# Patient Record
Sex: Female | Born: 1990 | Race: Black or African American | Hispanic: No | Marital: Single | Smoking: Never smoker
Health system: Southern US, Community
[De-identification: ages and names within clinical notes are randomized; demographics above are authoritative.]

## PROBLEM LIST (undated history)

## (undated) DIAGNOSIS — A749 Chlamydial infection, unspecified: Secondary | ICD-10-CM

## (undated) DIAGNOSIS — N83209 Unspecified ovarian cyst, unspecified side: Secondary | ICD-10-CM

## (undated) DIAGNOSIS — IMO0002 Reserved for concepts with insufficient information to code with codable children: Secondary | ICD-10-CM

## (undated) DIAGNOSIS — R87619 Unspecified abnormal cytological findings in specimens from cervix uteri: Secondary | ICD-10-CM

## (undated) HISTORY — PX: WISDOM TOOTH EXTRACTION: SHX21

## (undated) HISTORY — PX: OTHER SURGICAL HISTORY: SHX169

## (undated) HISTORY — PX: THERAPEUTIC ABORTION: SHX798

---
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
## 2011-02-07 LAB — RPR: RPR Ser Ql: NONREACTIVE

## 2011-02-15 ENCOUNTER — Inpatient Hospital Stay (INDEPENDENT_AMBULATORY_CARE_PROVIDER_SITE_OTHER)
Admission: RE | Admit: 2011-02-15 | Discharge: 2011-02-15 | Disposition: A | Discharge status: Home / Self Care | Source: Ambulatory Visit | Attending: Family Medicine | Admitting: Family Medicine

## 2011-02-15 DIAGNOSIS — N390 Urinary tract infection, site not specified: Secondary | ICD-10-CM

## 2011-02-15 DIAGNOSIS — A749 Chlamydial infection, unspecified: Secondary | ICD-10-CM

## 2011-02-15 DIAGNOSIS — A64 Unspecified sexually transmitted disease: Secondary | ICD-10-CM

## 2011-02-15 LAB — POCT URINALYSIS DIP (DEVICE)
Bilirubin Urine: NEGATIVE
Glucose, UA: NEGATIVE mg/dL
Hgb urine dipstick: NEGATIVE
Ketones, ur: NEGATIVE mg/dL
Nitrite: NEGATIVE
Protein, ur: NEGATIVE mg/dL
Specific Gravity, Urine: >=1.03 (ref 1.005–1.030)
Urobilinogen, UA: 0.2 mg/dL (ref 0.0–1.0)
pH: 6 (ref 5.0–8.0)

## 2011-02-15 LAB — WET PREP, GENITAL
Trich, Wet Prep: NONE SEEN
Yeast Wet Prep HPF POC: NONE SEEN

## 2011-02-15 LAB — POCT PREGNANCY, URINE: Preg Test, Ur: NEGATIVE

## 2011-02-16 LAB — GC/CHLAMYDIA PROBE AMP, GENITAL
Chlamydia, DNA Probe: NEGATIVE
GC Probe Amp, Genital: NEGATIVE

## 2011-07-20 ENCOUNTER — Emergency Department (HOSPITAL_COMMUNITY)
Admission: EM | Admit: 2011-07-20 | Discharge: 2011-07-21 | Disposition: A | Discharge status: Home / Self Care | Attending: Emergency Medicine | Admitting: Emergency Medicine

## 2011-07-20 DIAGNOSIS — N898 Other specified noninflammatory disorders of vagina: Secondary | ICD-10-CM | POA: Insufficient documentation

## 2011-07-20 LAB — POCT I-STAT, CHEM 8
BUN: 10 mg/dL (ref 6–23)
Calcium, Ion: 1.19 mmol/L (ref 1.12–1.32)
Chloride: 102 mEq/L (ref 96–112)
Creatinine, Ser: 0.8 mg/dL (ref 0.50–1.10)
Glucose, Bld: 123 mg/dL — ABNORMAL HIGH (ref 70–99)
HCT: 40 % (ref 36.0–46.0)
Hemoglobin: 13.6 g/dL (ref 12.0–15.0)
Potassium: 4.4 mEq/L (ref 3.5–5.1)
Sodium: 139 mEq/L (ref 135–145)
TCO2: 26 mmol/L (ref 0–100)

## 2011-07-20 LAB — CBC
HCT: 35.8 % — ABNORMAL LOW (ref 36.0–46.0)
Hemoglobin: 12.1 g/dL (ref 12.0–15.0)
MCH: 29.3 pg (ref 26.0–34.0)
MCHC: 33.8 g/dL (ref 30.0–36.0)
MCV: 86.7 fL (ref 78.0–100.0)
Platelets: 220 10*3/uL (ref 150–400)
RBC: 4.13 MIL/uL (ref 3.87–5.11)
RDW: 12.6 % (ref 11.5–15.5)
WBC: 9.6 10*3/uL (ref 4.0–10.5)

## 2011-07-20 LAB — DIFFERENTIAL
Basophils Absolute: 0 10*3/uL (ref 0.0–0.1)
Basophils Relative: 0 % (ref 0–1)
Eosinophils Absolute: 0 10*3/uL (ref 0.0–0.7)
Eosinophils Relative: 0 % (ref 0–5)
Lymphocytes Relative: 39 % (ref 12–46)
Lymphs Abs: 3.8 10*3/uL (ref 0.7–4.0)
Monocytes Absolute: 0.4 10*3/uL (ref 0.1–1.0)
Monocytes Relative: 4 % (ref 3–12)
Neutro Abs: 5.4 10*3/uL (ref 1.7–7.7)
Neutrophils Relative %: 57 % (ref 43–77)

## 2011-07-20 LAB — SAMPLE TO BLOOD BANK

## 2011-07-20 LAB — URINALYSIS, ROUTINE W REFLEX MICROSCOPIC
Bilirubin Urine: NEGATIVE
Glucose, UA: NEGATIVE mg/dL
Ketones, ur: NEGATIVE mg/dL
Nitrite: NEGATIVE
Protein, ur: NEGATIVE mg/dL
Specific Gravity, Urine: 1.019 (ref 1.005–1.030)
Urobilinogen, UA: 0.2 mg/dL (ref 0.0–1.0)
pH: 5.5 (ref 5.0–8.0)

## 2011-07-20 LAB — URINE MICROSCOPIC-ADD ON

## 2011-07-20 LAB — POCT PREGNANCY, URINE: Preg Test, Ur: NEGATIVE

## 2011-08-05 ENCOUNTER — Inpatient Hospital Stay (INDEPENDENT_AMBULATORY_CARE_PROVIDER_SITE_OTHER)
Admission: RE | Admit: 2011-08-05 | Discharge: 2011-08-05 | Disposition: A | Discharge status: Home / Self Care | Source: Ambulatory Visit | Attending: Emergency Medicine | Admitting: Emergency Medicine

## 2011-08-05 DIAGNOSIS — N760 Acute vaginitis: Secondary | ICD-10-CM

## 2011-08-05 LAB — WET PREP, GENITAL
Trich, Wet Prep: NONE SEEN
WBC, Wet Prep HPF POC: NONE SEEN
Yeast Wet Prep HPF POC: NONE SEEN

## 2011-08-05 LAB — POCT URINALYSIS DIP (DEVICE)
Bilirubin Urine: NEGATIVE
Glucose, UA: NEGATIVE mg/dL
Hgb urine dipstick: NEGATIVE
Ketones, ur: NEGATIVE mg/dL
Nitrite: NEGATIVE
Protein, ur: NEGATIVE mg/dL
Specific Gravity, Urine: 1.02 (ref 1.005–1.030)
Urobilinogen, UA: 0.2 mg/dL (ref 0.0–1.0)
pH: 6.5 (ref 5.0–8.0)

## 2011-08-05 LAB — POCT PREGNANCY, URINE: Preg Test, Ur: NEGATIVE

## 2011-08-08 LAB — GC/CHLAMYDIA PROBE AMP, GENITAL
Chlamydia, DNA Probe: NEGATIVE
GC Probe Amp, Genital: NEGATIVE

## 2011-12-11 ENCOUNTER — Emergency Department (INDEPENDENT_AMBULATORY_CARE_PROVIDER_SITE_OTHER)
Admission: EM | Admit: 2011-12-11 | Discharge: 2011-12-11 | Disposition: A | Discharge status: Home / Self Care | Source: Home / Self Care

## 2011-12-11 ENCOUNTER — Encounter (HOSPITAL_COMMUNITY): Payer: Self-pay | Admitting: *Deleted

## 2011-12-11 DIAGNOSIS — B373 Candidiasis of vulva and vagina: Secondary | ICD-10-CM

## 2011-12-11 LAB — POCT URINALYSIS DIP (DEVICE)
Bilirubin Urine: NEGATIVE
Glucose, UA: NEGATIVE mg/dL
Ketones, ur: NEGATIVE mg/dL
Nitrite: NEGATIVE
Protein, ur: NEGATIVE mg/dL
Specific Gravity, Urine: 1.02 (ref 1.005–1.030)
Urobilinogen, UA: 0.2 mg/dL (ref 0.0–1.0)
pH: 6 (ref 5.0–8.0)

## 2011-12-11 LAB — WET PREP, GENITAL
Trich, Wet Prep: NONE SEEN
Yeast Wet Prep HPF POC: NONE SEEN

## 2011-12-11 LAB — POCT PREGNANCY, URINE: Preg Test, Ur: NEGATIVE

## 2011-12-11 MED ORDER — MICONAZOLE NITRATE 100-2 MG-% VA KIT: 1.0000 | PACK | Freq: Every day | VAGINAL | Status: AC

## 2011-12-11 NOTE — ED Provider Notes (Signed)
History     CSN: [id number]  Arrival date & time 12/11/11  0911   None     Chief Complaint  Patient presents with  . Vaginal Bleeding  . Vaginal Discharge    (Consider location/radiation/quality/duration/timing/severity/associated sxs/prior treatment) HPI Comments: Finished tx for bv a week ago, then new sx of vaginal irritation and yellow odorless discharge began; these sx are different than the sx that she had previously  Patient is a 21 y.o. female presenting with vaginal bleeding and vaginal discharge. The history is provided by the patient.  Vaginal Bleeding This is a new problem. The current episode started more than 1 week ago. The problem occurs daily. The problem has not changed since onset.Pertinent negatives include no abdominal pain. The symptoms are aggravated by nothing. The symptoms are relieved by nothing. She has tried nothing for the symptoms.  Vaginal Discharge This is a new problem. The current episode started more than 1 week ago. The problem occurs constantly. The problem has been gradually worsening. Pertinent negatives include no abdominal pain. The symptoms are aggravated by nothing. The symptoms are relieved by nothing. Treatments tried: treatment for bv. The treatment provided mild relief.    Past Medical History  Diagnosis Date  . Bacterial vaginal infection     Past Surgical History  Procedure Date  . Arm surgery x3     History reviewed. No pertinent family history.  History  Substance Use Topics  . Smoking status: Never Smoker   . Smokeless tobacco: Not on file  . Alcohol Use: No    OB History    Grav Para Term Preterm Abortions TAB SAB Ect Mult Living                  Review of Systems  Constitutional: Negative for fever and chills.  Gastrointestinal: Negative for nausea, vomiting and abdominal pain.  Genitourinary: Positive for vaginal bleeding and vaginal discharge. Negative for dysuria, flank pain and difficulty urinating.   Vaginal irritation    Allergies  Review of patient's allergies indicates no known allergies.  Home Medications   Current Outpatient Rx  Name Route Sig Dispense Refill  . BALZIVA PO Oral Take by mouth.      BP 117/71  Pulse 61  Temp(Src) 98.5 F (36.9 C) (Oral)  Resp 16  SpO2 98%  LMP 12/04/2011  Physical Exam  Constitutional: She appears well-developed and well-nourished. No distress.  Cardiovascular: Normal rate and regular rhythm.   Pulmonary/Chest: Effort normal and breath sounds normal.  Abdominal: Soft. Bowel sounds are normal. There is no tenderness. There is no rebound and no guarding.  Genitourinary: Cervix exhibits no discharge. There is tenderness around the vagina. Vaginal discharge found.       Inner labia irritation and skin sloughing; vaginal irritation; thick white discharge; tiny amount bleeding from cervix    ED Course  Procedures (including critical care time)  Labs Reviewed  POCT URINALYSIS DIP (DEVICE) - Abnormal; Notable for the following:    Hgb urine dipstick MODERATE (*)    Leukocytes, UA MODERATE (*) Biochemical Testing Only. Please order routine urinalysis from main lab if confirmatory testing is needed.   All other components within normal limits  POCT PREGNANCY, URINE  POCT URINALYSIS DIPSTICK  POCT PREGNANCY, URINE  GC/CHLAMYDIA PROBE AMP, GENITAL  WET PREP, GENITAL   No results found.   No diagnosis found.    MDM          Cathlyn Parsons, NP 12/11/11 [phone]

## 2011-12-11 NOTE — ED Notes (Signed)
Finished Flagyl 7 days ago for BV.  Started with some discharge again the following day.  Started w/ light vaginal bleeding yesterday.  C/O pruritis and soreness to vaginal area.  Denies abd pain, fevers.

## 2011-12-12 LAB — GC/CHLAMYDIA PROBE AMP, GENITAL
Chlamydia, DNA Probe: NEGATIVE
GC Probe Amp, Genital: NEGATIVE

## 2011-12-13 ENCOUNTER — Telehealth (HOSPITAL_COMMUNITY): Payer: Self-pay | Admitting: *Deleted

## 2011-12-13 NOTE — ED Provider Notes (Signed)
Medical screening examination/treatment/procedure(s) were performed by non-physician practitioner and as supervising physician I was immediately available for consultation/collaboration.   Loxley Cibrian DOUGLAS MD.    Kellyn Mansfield Douglas Preet Perrier, MD 12/13/11 0821 

## 2011-12-13 NOTE — ED Notes (Signed)
GC/Chlamydia neg., Wet prep: Mod. clue cells, many WBC's.  Pt. treated with Monistat.  Order obtained from Dr. Ladon Applebaum for Flagyl 500 mg. 1 tab po BID x 7 days.  Unable to leave pt. message because VM not set up on cell phone. I left a message at her work number. Vassie Moselle 12/13/2011

## 2011-12-14 ENCOUNTER — Telehealth (HOSPITAL_COMMUNITY): Payer: Self-pay | Admitting: *Deleted

## 2011-12-14 NOTE — ED Notes (Signed)
Pt. notified and Rx. called. See telephone encounter. Vassie Moselle 12/14/2011

## 2012-02-14 ENCOUNTER — Encounter (HOSPITAL_COMMUNITY): Payer: Self-pay | Admitting: *Deleted

## 2012-02-14 ENCOUNTER — Inpatient Hospital Stay (HOSPITAL_COMMUNITY)
Admission: AD | Admit: 2012-02-14 | Discharge: 2012-02-14 | Disposition: A | Discharge status: Home / Self Care | Source: Ambulatory Visit | Attending: Obstetrics & Gynecology | Admitting: Obstetrics & Gynecology

## 2012-02-14 ENCOUNTER — Encounter (HOSPITAL_COMMUNITY): Payer: Self-pay

## 2012-02-14 ENCOUNTER — Inpatient Hospital Stay (HOSPITAL_COMMUNITY)

## 2012-02-14 ENCOUNTER — Emergency Department (INDEPENDENT_AMBULATORY_CARE_PROVIDER_SITE_OTHER)
Admission: EM | Admit: 2012-02-14 | Discharge: 2012-02-14 | Disposition: A | Discharge status: Home / Self Care | Source: Home / Self Care | Attending: Family Medicine | Admitting: Family Medicine

## 2012-02-14 DIAGNOSIS — E282 Polycystic ovarian syndrome: Secondary | ICD-10-CM | POA: Insufficient documentation

## 2012-02-14 DIAGNOSIS — R102 Pelvic and perineal pain: Secondary | ICD-10-CM

## 2012-02-14 DIAGNOSIS — N949 Unspecified condition associated with female genital organs and menstrual cycle: Secondary | ICD-10-CM | POA: Insufficient documentation

## 2012-02-14 DIAGNOSIS — R109 Unspecified abdominal pain: Secondary | ICD-10-CM | POA: Insufficient documentation

## 2012-02-14 HISTORY — DX: Unspecified ovarian cyst, unspecified side: N83.209

## 2012-02-14 LAB — POCT URINALYSIS DIP (DEVICE)
Bilirubin Urine: NEGATIVE
Glucose, UA: NEGATIVE mg/dL
Hgb urine dipstick: NEGATIVE
Ketones, ur: NEGATIVE mg/dL
Leukocytes, UA: NEGATIVE
Nitrite: NEGATIVE
Protein, ur: NEGATIVE mg/dL
Specific Gravity, Urine: 1.025 (ref 1.005–1.030)
Urobilinogen, UA: 0.2 mg/dL (ref 0.0–1.0)
pH: 5.5 (ref 5.0–8.0)

## 2012-02-14 LAB — WET PREP, GENITAL
Clue Cells Wet Prep HPF POC: NONE SEEN
Trich, Wet Prep: NONE SEEN
Yeast Wet Prep HPF POC: NONE SEEN

## 2012-02-14 LAB — POCT PREGNANCY, URINE: Preg Test, Ur: NEGATIVE

## 2012-02-14 NOTE — MAU Note (Signed)
Pt sent to [facility] from Urgent Care, RLQ pain since Friday, became worse last night.  Bled from 2/16 up until last week.  No bleeding now, has pulling sensation with urination.

## 2012-02-14 NOTE — ED Notes (Signed)
C/o pain RLQ- pt states she has had ovarian cyst in the past and this is what it felt like.  States the pain started on Friday and has been getting progressively worse since.  Also reports prolonged menses this last month.  States her period lasted from Feb 16- March 16.  Reports she stopped taking her oral contraceptive the first part of March.  Denies fever, nausea or vomiting.  States pain is worse with urination.

## 2012-02-14 NOTE — Discharge Instructions (Signed)
Abdominal Pain, Women       Abdominal (stomach, pelvic, or belly) pain can be caused by many things. It is important to tell your doctor:   The location of the pain.   Does it come and go or is it present all the time?   Are there things that start the pain (eating certain foods, exercise)?   Are there other symptoms associated with the pain (fever, nausea, vomiting, diarrhea)?  All of this is helpful to know when trying to find the cause of the pain.   CAUSES   Stomach: virus or bacteria infection, or ulcer.   Intestine: appendicitis (inflamed appendix), regional ileitis (Crohn's disease), ulcerative colitis (inflamed colon), irritable bowel syndrome, diverticulitis (inflamed diverticulum of the colon), or cancer of the stomach or intestine.   Gallbladder disease or stones in the gallbladder.   Kidney disease, kidney stones, or infection.   Pancreas infection or cancer.   Fibromyalgia (pain disorder).   Diseases of the female organs:   Uterus: fibroid (non-cancerous) tumors or infection.   Fallopian tubes: infection or tubal pregnancy.   Ovary: cysts or tumors.   Pelvic adhesions (scar tissue).   Endometriosis (uterus lining tissue growing in the pelvis and on the pelvic organs).   Pelvic congestion syndrome (female organs filling up with blood just before the menstrual period).   Pain with the menstrual period.   Pain with ovulation (producing an egg).   Pain with an IUD (intrauterine device, birth control) in the uterus.   Cancer of the female organs.   Functional pain (pain not caused by a disease, may improve without treatment).   Psychological pain.   Depression.  DIAGNOSIS   Your doctor will decide the seriousness of your pain by doing an examination.   Blood tests.   X-rays.   Ultrasound.   CT scan (computed tomography, special type of X-ray).   MRI (magnetic resonance imaging).   Cultures, for infection.   Barium enema (dye inserted in the large intestine, to better view it with X-rays).   Colonoscopy  (looking in intestine with a lighted tube).   Laparoscopy (minor surgery, looking in abdomen with a lighted tube).   Major abdominal exploratory surgery (looking in abdomen with a large incision).  TREATMENT   The treatment will depend on the cause of the pain.   Many cases can be observed and treated at home.   Over-the-counter medicines recommended by your caregiver.   Prescription medicine.   Antibiotics, for infection.   Birth control pills, for painful periods or for ovulation pain.   Hormone treatment, for endometriosis.   Nerve blocking injections.   Physical therapy.   Antidepressants.   Counseling with a psychologist or psychiatrist.   Minor or major surgery.  HOME CARE INSTRUCTIONS   Do not take laxatives, unless directed by your caregiver.   Take over-the-counter pain medicine only if ordered by your caregiver. Do not take aspirin because it can cause an upset stomach or bleeding.   Try a clear liquid diet (broth or water) as ordered by your caregiver. Slowly move to a bland diet, as tolerated, if the pain is related to the stomach or intestine.   Have a thermometer and take your temperature several times a day, and record it.   Bed rest and sleep, if it helps the pain.   Avoid sexual intercourse, if it causes pain.   Avoid stressful situations.   Keep your follow-up appointments and tests, as your caregiver orders.   If   try:   Acupuncture.   Relaxation exercises (yoga, meditation).   Group therapy.   Counseling.  SEEK MEDICAL CARE IF:   You notice certain foods cause stomach pain.   Your home care treatment is not helping your pain.   You need stronger pain medicine.   You want your IUD removed.   You feel faint or lightheaded.   You develop nausea and vomiting.   You develop a rash.   You are having side effects or  an allergy to your medicine.  SEEK IMMEDIATE MEDICAL CARE IF:   Your pain does not go away or gets worse.   You have a fever.   Your pain is felt only in portions of the abdomen. The right side could possibly be appendicitis. The left lower portion of the abdomen could be colitis or diverticulitis.   You are passing blood in your stools (bright red or black tarry stools, with or without vomiting).   You have blood in your urine.   You develop chills, with or without a fever.   You pass out.  MAKE SURE YOU:   Understand these instructions.   Will watch your condition.   Will get help right away if you are not doing well or get worse.  Document Released: 09/04/2007 Document Revised: 10/27/2011 Document Reviewed: 09/24/2009 [facility] Patient Information 2012 [place], [place].Polycystic Ovarian Syndrome Polycystic ovarian syndrome is a condition with a number of problems. One problem is with the ovaries. The ovaries are organs located in the female pelvis, on each side of the uterus. Usually, during the menstrual cycle, an egg is released from 1 ovary every month. This is called ovulation. When the egg is fertilized, it goes into the womb (uterus), which allows for the growth of a baby. The egg travels from the ovary through the fallopian tube to the uterus. The ovaries also make the hormones estrogen and progesterone. These hormones help the development of a woman's breasts, body shape, and body hair. They also regulate the menstrual cycle and pregnancy. Sometimes, cysts form in the ovaries. A cyst is a fluid-filled sac. On the ovary, different types of cysts can form. The most common type of ovarian cyst is called a functional or ovulation cyst. It is normal, and often forms during the normal menstrual cycle. Each month, a woman's ovaries grow tiny cysts that hold the eggs. When an egg is fully grown, the sac breaks open. This releases the egg. Then, the sac which released the egg from the  ovary dissolves. In one type of functional cyst, called a follicle cyst, the sac does not break open to release the egg. It may actually continue to grow. This type of cyst usually disappears within 1 to 3 months.  One type of cyst problem with the ovaries is called Polycystic Ovarian Syndrome (PCOS). In this condition, many follicle cysts form, but do not rupture and produce an egg. This health problem can affect the following:  Menstrual cycle.   Heart.   Obesity.   Cancer of the uterus.   Fertility.   Blood vessels.   Hair growth (face and body) or baldness.   Hormones.   Appearance.   High blood pressure.   Stroke.   Insulin production.   Inflammation of the liver.   Elevated blood cholesterol and triglycerides.  CAUSES   No one knows the exact cause of PCOS.   Women with PCOS often have a mother or sister with PCOS. There is not yet enough proof to  say this is inherited.   Many women with PCOS have a weight problem.   Researchers are looking at the relationship between PCOS and the body's ability to make insulin. Insulin is a hormone that regulates the change of sugar, starches, and other food into energy for the body's use, or for storage. Some women with PCOS make too much insulin. It is possible that the ovaries react by making too many female hormones, called androgens. This can lead to acne, excessive hair growth, weight gain, and ovulation problems.   Too much production of luteinizing hormone (LH) from the pituitary gland in the brain stimulates the ovary to produce too much female hormone (androgen).  SYMPTOMS   Infrequent or no menstrual periods, and/or irregular bleeding.   Inability to get pregnant (infertility), because of not ovulating.   Increased growth of hair on the face, chest, stomach, back, thumbs, thighs, or toes.   Acne, oily skin, or dandruff.   Pelvic pain.   Weight gain or obesity, usually carrying extra weight around the waist.   Type  2 diabetes (this is the diabetes that usually does not need insulin).   High cholesterol.   High blood pressure.   Female-pattern baldness or thinning hair.   Patches of thickened and dark brown or black skin on the neck, arms, breasts, or thighs.   Skin tags, or tiny excess flaps of skin, in the armpits or neck area.   Sleep apnea (excessive snoring and breathing stops at times while asleep).   Deepening of the voice.   Gestational diabetes when pregnant.   Increased risk of miscarriage with pregnancy.  DIAGNOSIS  There is no single test to diagnose PCOS.   Your caregiver will:   Take a medical history.   Perform a pelvic exam.   Perform an ultrasound.   Check your female and female hormone levels.   Measure glucose or sugar levels in the blood.   Do other blood tests.   If you are producing too many female hormones, your caregiver will make sure it is from PCOS. At the physical exam, your caregiver will want to evaluate the areas of increased hair growth. Try to allow natural hair growth for a few days before the visit.   During a pelvic exam, the ovaries may be enlarged or swollen by the increased number of small cysts. This can be seen more easily by vaginal ultrasound or screening, to examine the ovaries and lining of the uterus (endometrium) for cysts. The uterine lining may become thicker, if there has not been a regular period.  TREATMENT  Because there is no cure for PCOS, it needs to be managed to prevent problems. Treatments are based on your symptoms. Treatment is also based on whether you want to have a baby or whether you need contraception.  Treatment may include:  Progesterone hormone, to start a menstrual period.   Birth control pills, to make you have regular menstrual periods.   Medicines to make you ovulate, if you want to get pregnant.   Medicines to control your insulin.   Medicine to control your blood pressure.   Medicine and diet, to control  your high cholesterol and triglycerides in your blood.   Surgery, making small holes in the ovary, to decrease the amount of female hormone production. This is done through a long, lighted tube (laparoscope), placed into the pelvis through a tiny incision in the lower abdomen.  Your caregiver will go over some of the choices with you.  WOMEN WITH PCOS HAVE THESE CHARACTERISTICS:  High levels of female hormones called androgens.   An irregular or no menstrual cycle.   May have many small cysts in their ovaries.  PCOS is the most common hormonal reproductive problem in women of childbearing age. WHY DO WOMEN WITH PCOS HAVE TROUBLE WITH THEIR MENSTRUAL CYCLE? Each month, about 20 eggs start to mature in the ovaries. As one egg grows and matures, the follicle breaks open to release the egg, so it can travel through the fallopian tube for fertilization. When the single egg leaves the follicle, ovulation takes place. In women with PCOS, the ovary does not make all of the hormones it needs for any of the eggs to fully mature. They may start to grow and accumulate fluid, but no one egg becomes large enough. Instead, some may remain as cysts. Since no egg matures or is released, ovulation does not occur and the hormone progesterone is not made. Without progesterone, a woman's menstrual cycle is irregular or absent. Also, the cysts produce female hormones, which continue to prevent ovulation.  Document Released: 03/03/2005 Document Revised: 10/27/2011 Document Reviewed: 09/25/2009 [facility] Patient Information 2012 [place], [place].

## 2012-02-14 NOTE — ED Provider Notes (Signed)
History     CSN: [id number]  Arrival date & time 02/14/12  4098   First MD Initiated Contact with Patient 02/14/12 [phone]      Chief Complaint  Patient presents with  . Ovarian Cyst    (Consider location/radiation/quality/duration/timing/severity/associated sxs/prior treatment) Patient is a 21 y.o. female presenting with abdominal pain. The history is provided by the patient.  Abdominal Pain The primary symptoms of the illness include abdominal pain. The primary symptoms of the illness do not include fever, nausea, vomiting, diarrhea, dysuria, vaginal discharge or vaginal bleeding. Primary symptoms comment: had vag bleeding and d/c for 1 mo until mid mar when started on bcp, none for 10 days, began with pelvic pain on fri worse last eve. The current episode started more than 2 days ago. The onset of the illness was gradual. The problem has been gradually worsening.  Symptoms associated with the illness do not include frequency. Associated medical issues comments: h/o ovarian cyst..    Past Medical History  Diagnosis Date  . Bacterial vaginal infection   . Ovarian cyst     Past Surgical History  Procedure Date  . Arm surgery x3     No family history on file.  History  Substance Use Topics  . Smoking status: Never Smoker   . Smokeless tobacco: Not on file  . Alcohol Use: No    OB History    Grav Para Term Preterm Abortions TAB SAB Ect Mult Living                  Review of Systems  Constitutional: Negative for fever.  Gastrointestinal: Positive for abdominal pain. Negative for nausea, vomiting and diarrhea.  Genitourinary: Positive for menstrual problem and pelvic pain. Negative for dysuria, frequency, flank pain, vaginal bleeding and vaginal discharge.    Allergies  Review of patient's allergies indicates no known allergies.  Home Medications   Current Outpatient Rx  Name Route Sig Dispense Refill  . BALZIVA PO Oral Take by mouth.      BP 110/69  Pulse 72   Temp(Src) 98 F (36.7 C) (Oral)  Resp 12  SpO2 99%  LMP 01/07/2012  Physical Exam  Nursing note and vitals reviewed. Constitutional: She is oriented to person, place, and time. She appears well-developed and well-nourished.  Abdominal: Soft. Bowel sounds are normal. She exhibits no distension and no mass. There is no hepatosplenomegaly. There is tenderness in the suprapubic area. There is no rebound, no guarding and no CVA tenderness.    Neurological: She is alert and oriented to person, place, and time.  Skin: Skin is warm and dry.    ED Course  Procedures (including critical care time)   Labs Reviewed  POCT URINALYSIS DIP (DEVICE)  POCT PREGNANCY, URINE   No results found.   1. Pelvic pain in female       MDM  U/a and upreg  Neg.        Linna Hoff, MD 02/14/12 [phone]

## 2012-02-14 NOTE — Discharge Instructions (Signed)
[facility] and referral for birth control as needed.

## 2012-02-14 NOTE — MAU Provider Note (Signed)
History     CSN: [id number]  Arrival date and time: 02/14/12 0930   First Provider Initiated Contact with Patient 02/14/12 1042    Brooke Donovan is a 21 yo G1P0010 presenting with pelvic pain.  Chief Complaint  Patient presents with  . Abdominal Pain   HPI Brooke Donovan is a 21 yo G1P0010 presenting with pelvic pain since Friday (01/21/21/13).  She describes the pain a constant ache rated at 4-5/10 with worsening with walking and urination at which point the pain is also described as pulling and rated at 6/10.  She states the pain is similar to when she was previously diagnosed with a right ovarian cyst about 1 1/2 years ago.  She didn't receive treatment at that point.  She also c/o vaginal bleeding since 01/07/12. She notes the bleeding started similar to her normal menses, progressed to spotting, and then back to heavier bleeding again.  This pattern continued until her bleeding discontinued last week.  Patient notes that she recently discontinued OCP at the beginning of March.  She states she thought the OCPs were causing her to have recurrent BV. She has also had vaginal discharge during this time period, which is described as yellowish gray at onset then changing to thicker white discharge.  She has h/o recurrent episodes of BV, due to this she called her PCP and obtained a RX for Flagyl.  This didn't help the discharge so she then used OTC yeast infection cream which seems to have help.  She has had increased urine frequency, but denies dysuria, burning, hesitancy, fever, N/V, diarrhea, constipation, or recent sexual intercourse.  Patient was seen in the urgent care and sent here to MAU for further evaluation.  Urine pregnancy and UA testing was performed with negative results.   Pertinent Gynecological History: Menses: normal menses lasting 2-3 days, progressed to spotting for several days, and then back to heavier bleeding again.  This pattern continued until her bleeding  discontinued Bleeding: intermenstrual bleeding Contraception: none and patient recently discontinued DES exposure: unknown Sexually transmitted diseases:none; recurrent BV   Past Medical History  Diagnosis Date  . Bacterial vaginal infection   . Ovarian cyst     Past Surgical History  Procedure Date  . Arm surgery x3   . Wisdom tooth extraction     History reviewed. No pertinent family history.  History  Substance Use Topics  . Smoking status: Never Smoker   . Smokeless tobacco: Not on file  . Alcohol Use: No    Allergies: No Known Allergies  No prescriptions prior to admission    Review of Systems  Constitutional: Negative for fever.  Gastrointestinal: Positive for abdominal pain (RLQ/ pelvic pain). Negative for nausea, vomiting, diarrhea and constipation.  Genitourinary: Positive for frequency. Negative for dysuria and urgency.   Physical Exam   Blood pressure 112/75, pulse 73, temperature 98.4 F (36.9 C), temperature source Oral, resp. rate 16, height 5\' 10"  (1.778 m), weight 83.915 kg (185 lb), last menstrual period 01/07/2012.  Physical Exam  Constitutional: She is oriented to person, place, and time. She appears well-developed and well-nourished. No distress.  HENT:  Head: Normocephalic and atraumatic.  Neck: Neck supple.  Respiratory: Effort normal.  GI: Soft. Bowel sounds are normal. She exhibits no distension and no mass. There is tenderness (RLQ/ Pelvic region). There is no rebound and no guarding.  Musculoskeletal: Normal range of motion.  Neurological: She is alert and oriented to person, place, and time.  Skin: Skin is  warm and dry.  Pelvic: External genitalia with normal hair distributions, no lesions noted. Vagina pink with creamy white discharge, no lesions noted. Cervix closed, friable, and pink with creamy white discharge, no lesions noted. Tenderness with palpation of right adnexa, no tenderness with left adnexa or CMT noted.  Fullness  appreciated at right adnexa.  No masses appreciated.  MAU Course  Procedures Pelvic/transvaginal Ultrasound Wet prep, GC/CH Results for orders placed during the hospital encounter of 02/14/12 (from the past 48 hour(s))  WET PREP, GENITAL     Status: Abnormal   Collection Time   02/14/12 11:55 AM      Component Value Range Comment   Yeast Wet Prep HPF POC NONE SEEN  NONE SEEN     Trich, Wet Prep NONE SEEN  NONE SEEN     Clue Cells Wet Prep HPF POC NONE SEEN  NONE SEEN     WBC, Wet Prep HPF POC FEW (*) NONE SEEN  MODERATE BACTERIA SEEN  Transvaginal/Pelvic Ultrasound IMPRESSION:  1. Negative for pelvic cyst or solid mass.  2. Possible PCOS (polycystic ovarian syndrome) by ultrasound.  Suggest correlation with clinical history.  3. Small amount of free fluid in the cul-de-sac and right adnexa.    Assessment and Plan  A: 21 yo G1P0010 presenting with RLQ pain; possible PCOS by Ultrasound.  P: Discussed PCOS and BV with patient.  Suggested patient restart OCPs and continue workup for PCOS with her Gyn provider.  Patient declined OCPs.  Patient to go to ED if RLQ increases or she experiences symptoms suggestive of Appendicitis.  Mardene Speak 02/14/2012, 10:56 AM

## 2012-02-15 LAB — GC/CHLAMYDIA PROBE AMP, GENITAL
Chlamydia, DNA Probe: POSITIVE — AB
GC Probe Amp, Genital: NEGATIVE

## 2012-02-17 ENCOUNTER — Emergency Department (INDEPENDENT_AMBULATORY_CARE_PROVIDER_SITE_OTHER)
Admission: EM | Admit: 2012-02-17 | Discharge: 2012-02-17 | Disposition: A | Discharge status: Home / Self Care | Source: Home / Self Care | Attending: Family Medicine | Admitting: Family Medicine

## 2012-02-17 ENCOUNTER — Encounter (HOSPITAL_COMMUNITY): Payer: Self-pay | Admitting: *Deleted

## 2012-02-17 DIAGNOSIS — A749 Chlamydial infection, unspecified: Secondary | ICD-10-CM

## 2012-02-17 HISTORY — DX: Chlamydial infection, unspecified: A74.9

## 2012-02-17 MED ORDER — AZITHROMYCIN 250 MG PO TABS
ORAL_TABLET | ORAL | Status: AC
  Filled 2012-02-17: qty 4

## 2012-02-17 MED ORDER — AZITHROMYCIN 250 MG PO TABS: 1000.0000 mg | ORAL_TABLET | Freq: Once | ORAL | Status: DC

## 2012-02-17 NOTE — Discharge Instructions (Signed)
See your doctor as needed

## 2012-02-17 NOTE — ED Notes (Signed)
Pt states she was seen at the [facility] clinic on Tuesday and tested for STD's.  States that she got a phone call yesterday telling her she was positive for Chlamydia and needed treatment.  States the health department was closed today.

## 2012-02-17 NOTE — ED Provider Notes (Signed)
History     CSN: [id number]  Arrival date & time 02/17/12  0946   First MD Initiated Contact with Patient 02/17/12 1213      Chief Complaint  Patient presents with  . SEXUALLY TRANSMITTED DISEASE    (Consider location/radiation/quality/duration/timing/severity/associated sxs/prior treatment) Patient is a 21 y.o. female presenting with STD exposure. The history is provided by the patient.  Exposure to STD This is a new problem. Episode onset: seen 3/26 and dx'd with pcos and also had std check and advised of positive chlamydia test that needs treatment. Pertinent negatives include no abdominal pain.    Past Medical History  Diagnosis Date  . Bacterial vaginal infection   . Ovarian cyst   . Chlamydia     Past Surgical History  Procedure Date  . Arm surgery x3   . Wisdom tooth extraction     No family history on file.  History  Substance Use Topics  . Smoking status: Never Smoker   . Smokeless tobacco: Not on file  . Alcohol Use: No    OB History    Grav Para Term Preterm Abortions TAB SAB Ect Mult Living   1    1 1           Review of Systems  Gastrointestinal: Negative.  Negative for abdominal pain.  Genitourinary: Negative.     Allergies  Review of patient's allergies indicates no known allergies.  Home Medications  No current outpatient prescriptions on file.  BP 129/75  Pulse 58  Temp(Src) 98.7 F (37.1 C) (Oral)  Resp 16  SpO2 100%  LMP 01/07/2012  Physical Exam  Nursing note and vitals reviewed. Constitutional: She is oriented to person, place, and time. She appears well-developed and well-nourished.  Abdominal: Soft. There is no tenderness.  Neurological: She is alert and oriented to person, place, and time.  Skin: Skin is warm and dry.    ED Course  Procedures (including critical care time)  Labs Reviewed - No data to display No results found.   1. Chlamydia infection       MDM          Linna Hoff, MD 02/17/12  1225

## 2012-05-30 ENCOUNTER — Encounter (HOSPITAL_COMMUNITY): Payer: Self-pay | Admitting: *Deleted

## 2012-05-30 ENCOUNTER — Emergency Department (INDEPENDENT_AMBULATORY_CARE_PROVIDER_SITE_OTHER)
Admission: EM | Admit: 2012-05-30 | Discharge: 2012-05-30 | Disposition: A | Discharge status: Home / Self Care | Source: Home / Self Care | Attending: Family Medicine | Admitting: Family Medicine

## 2012-05-30 DIAGNOSIS — L259 Unspecified contact dermatitis, unspecified cause: Secondary | ICD-10-CM

## 2012-05-30 DIAGNOSIS — L309 Dermatitis, unspecified: Secondary | ICD-10-CM

## 2012-05-30 MED ORDER — HYDROXYZINE HCL 25 MG PO TABS: 25.0000 mg | ORAL_TABLET | Freq: Four times a day (QID) | ORAL | Status: AC

## 2012-05-30 MED ORDER — TRIAMCINOLONE ACETONIDE 0.5 % EX OINT: TOPICAL_OINTMENT | Freq: Two times a day (BID) | CUTANEOUS | Status: DC

## 2012-05-30 NOTE — ED Notes (Signed)
Pt  Reports     Symptoms  Of  Rash          That  Itches     X  4  Days          The rash is  In  The  Hand  Area        No  Known  Causative  Agents  Or  Any  New  meds

## 2012-05-30 NOTE — ED Provider Notes (Signed)
History     CSN: [id number]  Arrival date & time 05/30/12  1308   First MD Initiated Contact with Patient 05/30/12 1505      Chief Complaint  Patient presents with  . Rash    (Consider location/radiation/quality/duration/timing/severity/associated sxs/prior treatment) HPI Comments: 21 year old female with no significant past medical history. Here complaining of hand rash, more on left than right. First noticed about 4 days ago. No significant itchiness. 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 She thinks that the gloves she wears at work are latex free. She does use frequent hand soap/sanitizer. Not using any new hand creams. She had her nails done about a week ago and there is a soaking solution she was put on. The rash does not affect any other skin areas. Denies any other symptoms.    Past Medical History  Diagnosis Date  . Bacterial vaginal infection   . Ovarian cyst   . Chlamydia     Past Surgical History  Procedure Date  . Arm surgery x3   . Wisdom tooth extraction     No family history on file.  History  Substance Use Topics  . Smoking status: Never Smoker   . Smokeless tobacco: Not on file  . Alcohol Use: No    OB History    Grav Para Term Preterm Abortions TAB SAB Ect Mult Living   1    1 1           Review of Systems  Constitutional: Negative for fever, chills, activity change, appetite change and fatigue.       10 systems reviewed and  pertinent negative and positive symptoms are as per HPI.     HENT: Negative for congestion and rhinorrhea.   Eyes: Negative for redness and itching.  Respiratory: Negative for cough, shortness of breath and wheezing.   Gastrointestinal: Negative for nausea, vomiting, abdominal pain and diarrhea.  Skin: Positive for rash.  All other systems reviewed and are negative.    Allergies  Review of patient's allergies indicates no known allergies.  Home Medications   Current Outpatient Rx  Name Route Sig  Dispense Refill  . HYDROXYZINE HCL 25 MG PO TABS Oral Take 1 tablet (25 mg total) by mouth every 6 (six) hours. 20 tablet 0  . TRIAMCINOLONE ACETONIDE 0.5 % EX OINT Topical Apply topically 2 (two) times daily. 30 g 0    BP 112/72  Pulse 71  Temp 98.5 F (36.9 C) (Oral)  Resp 14  SpO2 100%  LMP 05/05/2012  Physical Exam  Nursing note and vitals reviewed. Constitutional: She is oriented to person, place, and time. She appears well-developed and well-nourished. No distress.  HENT:  Head: Normocephalic and atraumatic.  Nose: Nose normal.  Mouth/Throat: Oropharynx is clear and moist. No oropharyngeal exudate.  Eyes: Conjunctivae and EOM are normal. Pupils are equal, round, and reactive to light. No scleral icterus.  Neck: Normal range of motion. Neck supple. No thyromegaly present.  Cardiovascular: Normal rate, regular rhythm and normal heart sounds.   Pulmonary/Chest: Effort normal and breath sounds normal. She has no wheezes.  Abdominal: Soft. There is no tenderness.       No hepatosplenomegaly.   Lymphadenopathy:    She has no cervical adenopathy.  Neurological: She is alert and oriented to person, place, and time.  Skin:       There is a dry mils scattered hypopigmented papular rash confined to the dorsum of hands, more in the area of fingers  more of the left hand. Does not affect palms. No vesicles or ulcers. No erythematous or swelling of the base skin.  Rest of body skin is clear.    ED Course  Procedures (including critical care time)  Labs Reviewed - No data to display No results found.   1. Eczema of hand       MDM  Impress contact dermatitis vs eczema clinical findings only related to the dorsal aspect of her hands. Asked to identify possible triggers. Prescribed triamcinolone ointment and hydroxyzine tablets. Support care discussed with patient and provided in writing. Dermatology referral to follow as needed.        Sharin Grave, MD 05/31/12 1112

## 2012-07-30 ENCOUNTER — Emergency Department (INDEPENDENT_AMBULATORY_CARE_PROVIDER_SITE_OTHER)
Admission: EM | Admit: 2012-07-30 | Discharge: 2012-07-30 | Disposition: A | Discharge status: Home / Self Care | Source: Home / Self Care | Attending: Emergency Medicine | Admitting: Emergency Medicine

## 2012-07-30 ENCOUNTER — Encounter (HOSPITAL_COMMUNITY): Payer: Self-pay | Admitting: *Deleted

## 2012-07-30 DIAGNOSIS — N3000 Acute cystitis without hematuria: Secondary | ICD-10-CM

## 2012-07-30 LAB — POCT URINALYSIS DIP (DEVICE)
Bilirubin Urine: NEGATIVE
Glucose, UA: NEGATIVE mg/dL
Ketones, ur: NEGATIVE mg/dL
Leukocytes, UA: NEGATIVE
Nitrite: NEGATIVE
Protein, ur: NEGATIVE mg/dL
Specific Gravity, Urine: 1.02 (ref 1.005–1.030)
Urobilinogen, UA: 0.2 mg/dL (ref 0.0–1.0)
pH: 6 (ref 5.0–8.0)

## 2012-07-30 LAB — POCT PREGNANCY, URINE: Preg Test, Ur: POSITIVE — AB

## 2012-07-30 MED ORDER — PHENAZOPYRIDINE HCL 200 MG PO TABS: 200.0000 mg | ORAL_TABLET | Freq: Three times a day (TID) | ORAL | Status: AC | PRN

## 2012-07-30 MED ORDER — CEPHALEXIN 500 MG PO CAPS: 500.0000 mg | ORAL_CAPSULE | Freq: Three times a day (TID) | ORAL | Status: DC

## 2012-07-30 NOTE — ED Provider Notes (Signed)
Chief Complaint  Patient presents with  . Urinary Tract Infection    History of Present Illness:   Brooke Donovan is a 20 year old female who has had a three-day history of dysuria, frequency, urgency, blood in the urine. She's had slight suprapubic pain and some vaginal discharge. She denies any fever, chills, nausea, vomiting, or abnormal vaginal bleeding. She had an abortion about a month ago and has not been sexually active since that.  Review of Systems:  Other than noted above, the patient denies any of the following symptoms: General:  No fevers, chills, sweats, aches, or fatigue. GI:  No abdominal pain, back pain, nausea, vomiting, diarrhea, or constipation. GU:  No dysuria, frequency, urgency, hematuria, or incontinence. GYN:  No discharge, itching, vulvar pain or lesions, pelvic pain, or abnormal vaginal bleeding.  PMFSH:  Past medical history, family history, social history, meds, and allergies were reviewed.  Physical Exam:   Vital signs:  BP 125/71  Pulse 67  Temp 98.5 F (36.9 C) (Oral)  Resp 14  SpO2 100%  LMP 05/05/2012 Gen:  Alert, oriented, in no distress. Lungs:  Clear to auscultation, no wheezes, rales or rhonchi. Heart:  Regular rhythm, no gallop or murmer. Abdomen:  Flat and soft. There was slight suprapubic pain to palpation.  No guarding, or rebound.  No hepato-splenomegaly or mass.  Bowel sounds were normally active.  No hernia. Back:  No CVA tenderness.  Skin:  Clear, warm and dry.  Labs:   Results for orders placed during the hospital encounter of 07/30/12  POCT URINALYSIS DIP (DEVICE)      Component Value Range   Glucose, UA NEGATIVE  NEGATIVE mg/dL   Bilirubin Urine NEGATIVE  NEGATIVE   Ketones, ur NEGATIVE  NEGATIVE mg/dL   Specific Gravity, Urine 1.020  1.005 - 1.030   Hgb urine dipstick LARGE (*) NEGATIVE   pH 6.0  5.0 - 8.0   Protein, ur NEGATIVE  NEGATIVE mg/dL   Urobilinogen, UA 0.2  0.0 - 1.0 mg/dL   Nitrite NEGATIVE  NEGATIVE   Leukocytes, UA  NEGATIVE  NEGATIVE  POCT PREGNANCY, URINE      Component Value Range   Preg Test, Ur POSITIVE (*) NEGATIVE     Other Labs Obtained at Urgent Care Center:  A urine culture was obtained.  Results are pending at this time and we will call about any positive results.  Assessment: The encounter diagnosis was Acute cystitis.   She appears to have urinary tract infection. I think the urine positive for pregnancy was probably due to residual from her abortion. She was informed of this. She states there is no way that she could be pregnant.  Plan:   1.  The following meds were prescribed:   New Prescriptions   CEPHALEXIN (KEFLEX) 500 MG CAPSULE    Take 1 capsule (500 mg total) by mouth 3 (three) times daily.   PHENAZOPYRIDINE (PYRIDIUM) 200 MG TABLET    Take 1 tablet (200 mg total) by mouth 3 (three) times daily as needed for pain.   2.  The patient was instructed in symptomatic care and handouts were given. 3.  The patient was told to return if becoming worse in any way, if no better in 3 or 4 days, and given some red flag symptoms that would indicate earlier return. 4.  The patient was told to avoid intercourse for 10 days, get extra fluids, and return for a follow up with her primary care doctor at the completion of treatment for  a repeat UA and culture.     Reuben Likes, MD 07/30/12 [phone]

## 2012-07-30 NOTE — ED Notes (Signed)
2 weeks of urinary burning and yellowish vaginal discharge.  She reports she had a TAB 06/27/2012

## 2012-08-02 LAB — URINE CULTURE: Colony Count: 10000

## 2012-08-06 ENCOUNTER — Emergency Department (HOSPITAL_COMMUNITY)
Admission: EM | Admit: 2012-08-06 | Discharge: 2012-08-06 | Disposition: A | Discharge status: Home / Self Care | Attending: Emergency Medicine | Admitting: Emergency Medicine

## 2012-08-06 ENCOUNTER — Emergency Department (HOSPITAL_COMMUNITY)

## 2012-08-06 ENCOUNTER — Encounter (HOSPITAL_COMMUNITY): Payer: Self-pay | Admitting: Emergency Medicine

## 2012-08-06 DIAGNOSIS — R109 Unspecified abdominal pain: Secondary | ICD-10-CM

## 2012-08-06 DIAGNOSIS — N898 Other specified noninflammatory disorders of vagina: Secondary | ICD-10-CM | POA: Insufficient documentation

## 2012-08-06 DIAGNOSIS — R319 Hematuria, unspecified: Secondary | ICD-10-CM | POA: Insufficient documentation

## 2012-08-06 DIAGNOSIS — N83209 Unspecified ovarian cyst, unspecified side: Secondary | ICD-10-CM | POA: Insufficient documentation

## 2012-08-06 LAB — BASIC METABOLIC PANEL
BUN: 11 mg/dL (ref 6–23)
CO2: 29 mEq/L (ref 19–32)
Calcium: 9.5 mg/dL (ref 8.4–10.5)
Chloride: 103 mEq/L (ref 96–112)
Creatinine, Ser: 0.7 mg/dL (ref 0.50–1.10)
GFR calc Af Amer: >90 mL/min (ref >90)
GFR calc non Af Amer: >90 mL/min (ref >90)
Glucose, Bld: 94 mg/dL (ref 70–99)
Potassium: 4 mEq/L (ref 3.5–5.1)
Sodium: 140 mEq/L (ref 135–145)

## 2012-08-06 LAB — CBC WITH DIFFERENTIAL/PLATELET
Basophils Absolute: 0 10*3/uL (ref 0.0–0.1)
Basophils Relative: 0 % (ref 0–1)
Eosinophils Absolute: 0 10*3/uL (ref 0.0–0.7)
Eosinophils Relative: 1 % (ref 0–5)
HCT: 37.4 % (ref 36.0–46.0)
Hemoglobin: 12.1 g/dL (ref 12.0–15.0)
Lymphocytes Relative: 45 % (ref 12–46)
Lymphs Abs: 2.3 10*3/uL (ref 0.7–4.0)
MCH: 28.5 pg (ref 26.0–34.0)
MCHC: 32.4 g/dL (ref 30.0–36.0)
MCV: 88.2 fL (ref 78.0–100.0)
Monocytes Absolute: 0.3 10*3/uL (ref 0.1–1.0)
Monocytes Relative: 6 % (ref 3–12)
Neutro Abs: 2.5 10*3/uL (ref 1.7–7.7)
Neutrophils Relative %: 48 % (ref 43–77)
Platelets: 187 10*3/uL (ref 150–400)
RBC: 4.24 MIL/uL (ref 3.87–5.11)
RDW: 12.4 % (ref 11.5–15.5)
WBC: 5.2 10*3/uL (ref 4.0–10.5)

## 2012-08-06 LAB — URINALYSIS, ROUTINE W REFLEX MICROSCOPIC
Bilirubin Urine: NEGATIVE
Glucose, UA: NEGATIVE mg/dL
Ketones, ur: NEGATIVE mg/dL
Nitrite: NEGATIVE
Protein, ur: NEGATIVE mg/dL
Specific Gravity, Urine: 1.011 (ref 1.005–1.030)
Urobilinogen, UA: 1 mg/dL (ref 0.0–1.0)
pH: 7.5 (ref 5.0–8.0)

## 2012-08-06 LAB — URINE MICROSCOPIC-ADD ON

## 2012-08-06 LAB — WET PREP, GENITAL
Clue Cells Wet Prep HPF POC: NONE SEEN
Trich, Wet Prep: NONE SEEN
Yeast Wet Prep HPF POC: NONE SEEN

## 2012-08-06 LAB — POCT PREGNANCY, URINE: Preg Test, Ur: NEGATIVE

## 2012-08-06 NOTE — ED Notes (Signed)
Pt reports vaginal bleeding for over 1 week associated with UTI that she was diagnosed with from urgent care.  Pt has pain at umbilicus 8/10 that is sharp and constant.  Pt alert oriented X4

## 2012-08-06 NOTE — ED Provider Notes (Signed)
History     CSN: [id number]  Arrival date & time 08/06/12  1234   First MD Initiated Contact with Patient 08/06/12 1553      Chief Complaint  Patient presents with  . Abdominal Pain  . Vaginal Bleeding  . Rash    (Consider location/radiation/quality/duration/timing/severity/associated sxs/prior treatment) HPI Comments: Brooke Donovan presents ambulatory for evaluation of hematuria.  She states she was seen for the same complaint and prescribed keflex and pyridium on 9/9.  She has been taking the medication without resolution of the hematuria.  She denies any ongoing dysuria or increased frequency.  She developed lower abdominal pain within the last 24 hours.  She describes a cramping that has become quite persistent.  She denies any fever, masses, NVD, constipation, discharges, or vaginal bleeding.  In early August she had a D&C (elective surgical abortion).  She reports roughly 2 weeks of mild cramping and spotting afterwards but denies any discomfort since.  She has not had a period since July.  The history is provided by the patient. No language interpreter was used.    Past Medical History  Diagnosis Date  . Bacterial vaginal infection   . Ovarian cyst   . Chlamydia     Past Surgical History  Procedure Date  . Arm surgery x3   . Wisdom tooth extraction   . Therapeutic abortion     History reviewed. No pertinent family history.  History  Substance Use Topics  . Smoking status: Never Smoker   . Smokeless tobacco: Not on file  . Alcohol Use: No    OB History    Grav Para Term Preterm Abortions TAB SAB Ect Mult Living   1    1 1           Review of Systems  Constitutional: Negative.   HENT: Negative.   Eyes: Negative.   Respiratory: Negative.   Cardiovascular: Negative.   Gastrointestinal: Positive for abdominal pain. Negative for nausea, vomiting, diarrhea, constipation, blood in stool and abdominal distention.  Genitourinary: Positive for hematuria and  menstrual problem. Negative for dysuria, urgency, frequency, flank pain, decreased urine volume, vaginal bleeding, vaginal discharge, enuresis, difficulty urinating, genital sores, vaginal pain, pelvic pain and dyspareunia.  Musculoskeletal: Negative.   Skin: Negative.   Neurological: Negative.   Psychiatric/Behavioral: Positive for decreased concentration.    Allergies  Review of patient's allergies indicates no known allergies.  Home Medications   Current Outpatient Rx  Name Route Sig Dispense Refill  . CEPHALEXIN 500 MG PO CAPS Oral Take 500 mg by mouth 3 (three) times daily. For 10 days Started 9/9    . PHENAZOPYRIDINE HCL 200 MG PO TABS Oral Take 200 mg by mouth 3 (three) times daily. For 5 days Started 9/9    . PROBIOTIC PO Oral Take 1 capsule by mouth daily.    . TRIAMCINOLONE ACETONIDE 0.5 % EX OINT Topical Apply 1 application topically 3 (three) times daily as needed. For itching to face      BP 120/76  Pulse 65  Temp 98.6 F (37 C) (Oral)  Resp 18  SpO2 97%  LMP 05/05/2012  Physical Exam  Nursing note and vitals reviewed. Constitutional: She is oriented to person, place, and time. She appears well-developed and well-nourished. No distress.  HENT:  Head: Normocephalic and atraumatic.  Right Ear: External ear normal.  Left Ear: External ear normal.  Mouth/Throat: Oropharynx is clear and moist. No oropharyngeal exudate.  Eyes: Conjunctivae normal are normal. Pupils are equal, round, and  reactive to light. Right eye exhibits no discharge. Left eye exhibits no discharge. No scleral icterus.  Neck: Normal range of motion. Neck supple. No JVD present. No tracheal deviation present. No thyromegaly present.  Cardiovascular: Normal rate, regular rhythm, normal heart sounds and intact distal pulses.  Exam reveals no gallop and no friction rub.   No murmur heard. Pulmonary/Chest: Effort normal and breath sounds normal. No stridor. No respiratory distress. She has no wheezes.  She has no rales. She exhibits no tenderness.  Abdominal: Soft. Bowel sounds are normal. She exhibits no distension, no pulsatile liver, no abdominal bruit, no ascites, no pulsatile midline mass and no mass. There is no hepatosplenomegaly, splenomegaly or hepatomegaly. There is tenderness in the periumbilical area and suprapubic area. There is no rigidity, no rebound, no guarding, no CVA tenderness, no tenderness at McBurney's point and negative Murphy's sign. No hernia. Hernia confirmed negative in the right inguinal area and confirmed negative in the left inguinal area.  Genitourinary: Uterus normal. Rectal exam shows no external hemorrhoid. Pelvic exam was performed with patient supine. No labial fusion. There is no rash, tenderness, lesion or injury on the right labia. There is no rash, tenderness, lesion or injury on the left labia. Uterus is not deviated, not enlarged, not fixed and not tender. Cervix exhibits no motion tenderness, no discharge and no friability. Right adnexum displays no mass, no tenderness and no fullness. Left adnexum displays no mass, no tenderness and no fullness. No erythema, tenderness or bleeding around the vagina. No foreign body around the vagina. No signs of injury around the vagina. Vaginal discharge found.       Scant discharge  Musculoskeletal: Normal range of motion.  Lymphadenopathy:    She has no cervical adenopathy.       Right: No inguinal adenopathy present.       Left: No inguinal adenopathy present.  Neurological: She is alert and oriented to person, place, and time.  Skin: Skin is warm and dry. No rash noted. She is not diaphoretic. No erythema. No pallor.  Psychiatric: She has a normal mood and affect. Her behavior is normal.    ED Course  Procedures (including critical care time)  Labs Reviewed  URINALYSIS, ROUTINE W REFLEX MICROSCOPIC - Abnormal; Notable for the following:    APPearance HAZY (*)     Hgb urine dipstick LARGE (*)     Leukocytes, UA  TRACE (*)     All other components within normal limits  URINE MICROSCOPIC-ADD ON - Abnormal; Notable for the following:    Squamous Epithelial / LPF FEW (*)     All other components within normal limits  CBC WITH DIFFERENTIAL  BASIC METABOLIC PANEL  POCT PREGNANCY, URINE  GC/CHLAMYDIA PROBE AMP, GENITAL  WET PREP, GENITAL  CBC  COMPREHENSIVE METABOLIC PANEL   No results found.   No diagnosis found.    MDM  Pt presents for evaluation of new onset abdominal discomfort and persistent hematuria.  She appears comfortable, NAD.  She has some vague lower abdominal discomfort but no evidence on a surgical abdomen.  Plan pelvic exam, basic belly, labs, and serial reassessments  2220.  Pt stable, NAD.  CT negative for urinary obstruction of kidney stone.  There is a small 3.2cm cystic structure noted in the right ovary.  This is a likely ovarian cyst.  On pelvic exam she has no CMT, adnexal fullness/tenderness/masses.  She has no clinical evidence of PID or ovarian torsion.  Plan discharge home to f/u  as an outpt with gyn.      Tobin Chad, MD 08/06/12 2228

## 2012-08-06 NOTE — ED Notes (Signed)
Pt c/o mid abd pain x several days with vaginal bleeding and rash on face; pt sts recently treated for UTI but vaginal bleeding not improved

## 2012-08-07 LAB — GC/CHLAMYDIA PROBE AMP, GENITAL
Chlamydia, DNA Probe: NEGATIVE
GC Probe Amp, Genital: NEGATIVE

## 2012-08-08 NOTE — ED Notes (Signed)
Urine culture: 10,000 colonies Staph. species (coagulase negative).  Pt. treated with Keflex- not on sensitivity. Lab shown to Dr. Lorenz Coaster and he said it was [facility]. Brooke Donovan 08/08/2012

## 2012-08-13 IMAGING — US US TRANSVAGINAL NON-OB
1 series · 13 of 25 positions shown · non-contrast
Comparison: None.

CLINICAL DATA: Right lower quadrant pain.  History of ovarian cyst.

TRANSABDOMINAL AND TRANSVAGINAL ULTRASOUND OF PELVIS
TECHNIQUE: Both transabdominal and transvaginal ultrasound
examinations of the pelvis were performed. Transabdominal technique
was performed for global imaging of the pelvis including uterus,
ovaries, adnexal regions, and pelvic cul-de-sac.

[Series 1: us pelvis complete · 13 of 106 slices shown]
[im 1/106]
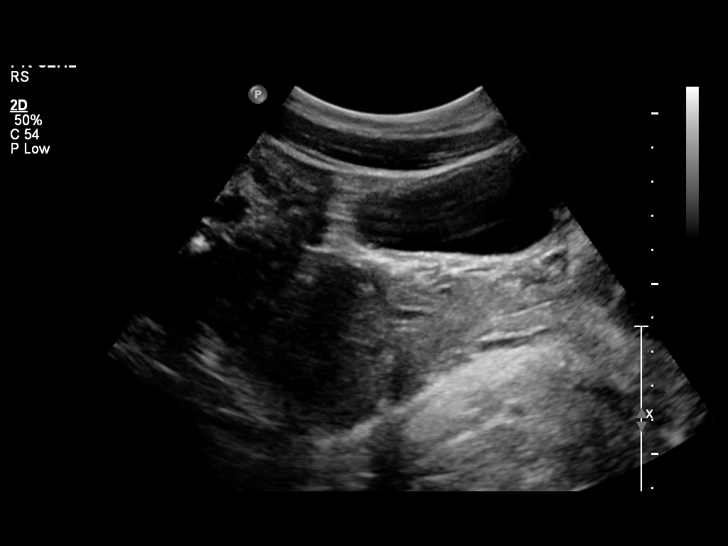
[im 9/106]
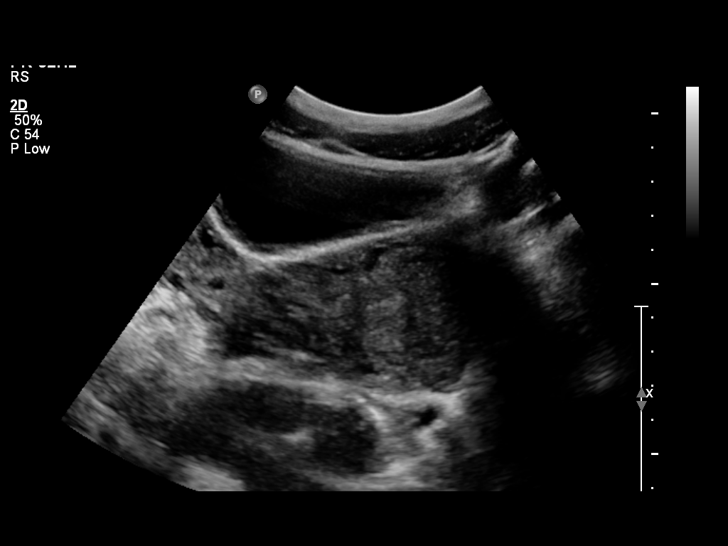
[im 18/106]
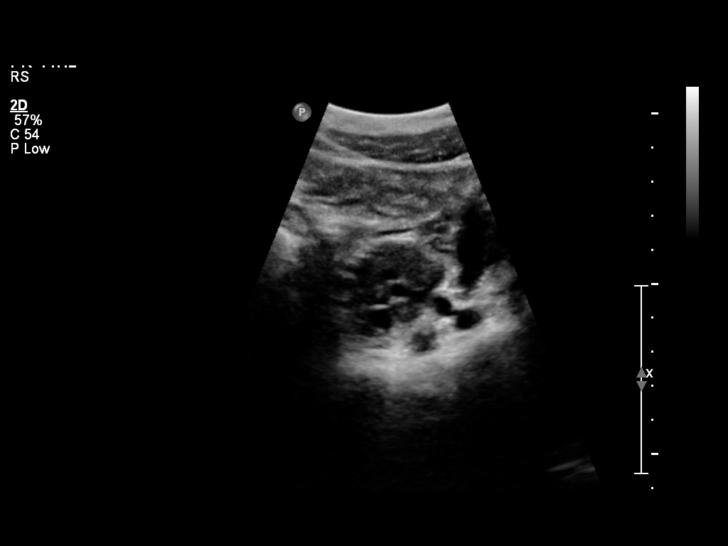
[im 27/106]
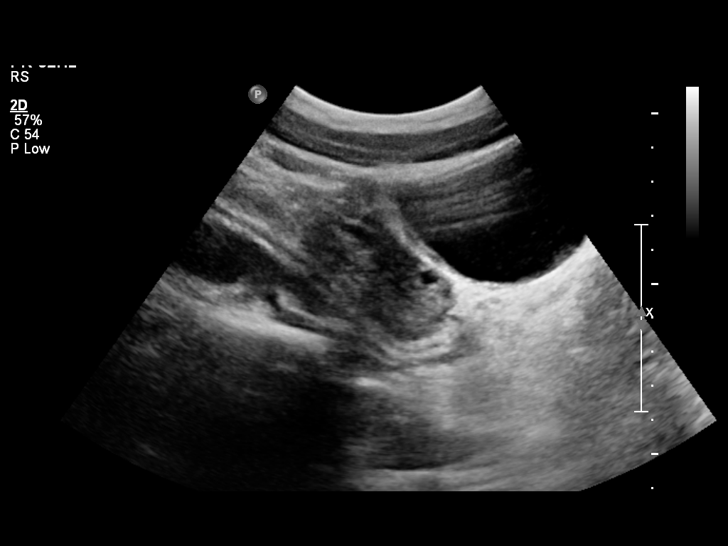
[im 36/106]
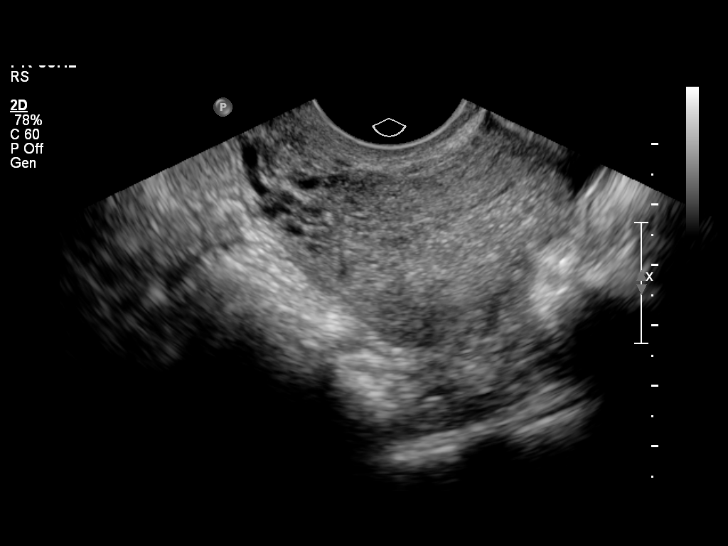
[im 44/106]
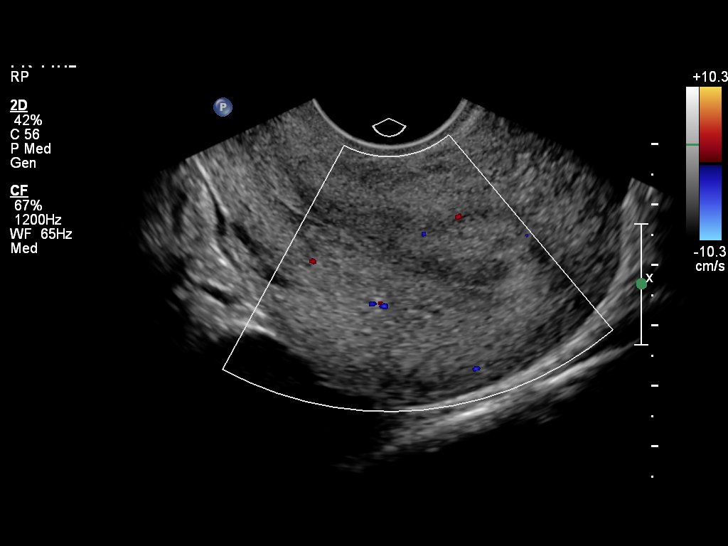
[im 53/106]
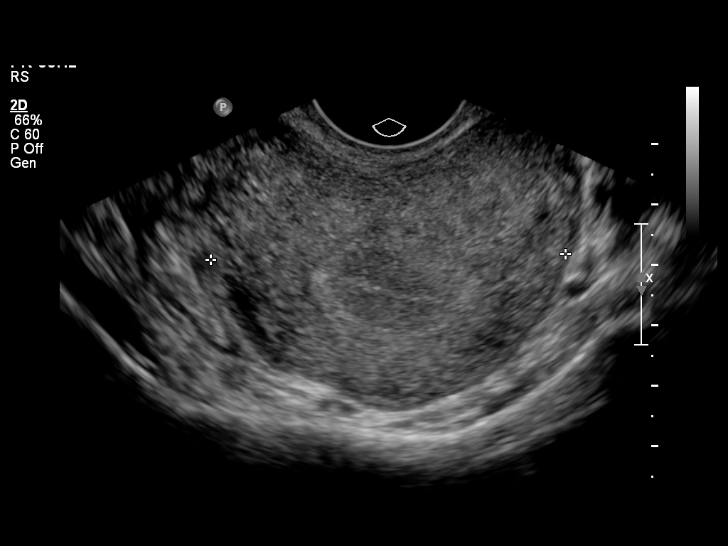
[im 62/106]
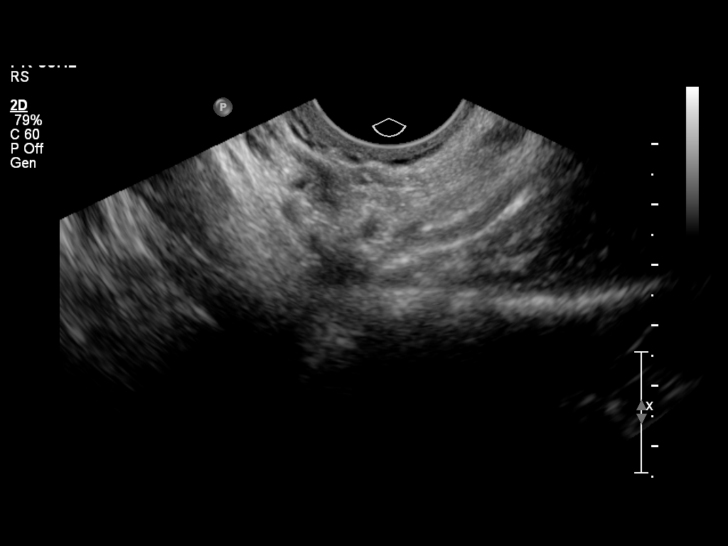
[im 71/106]
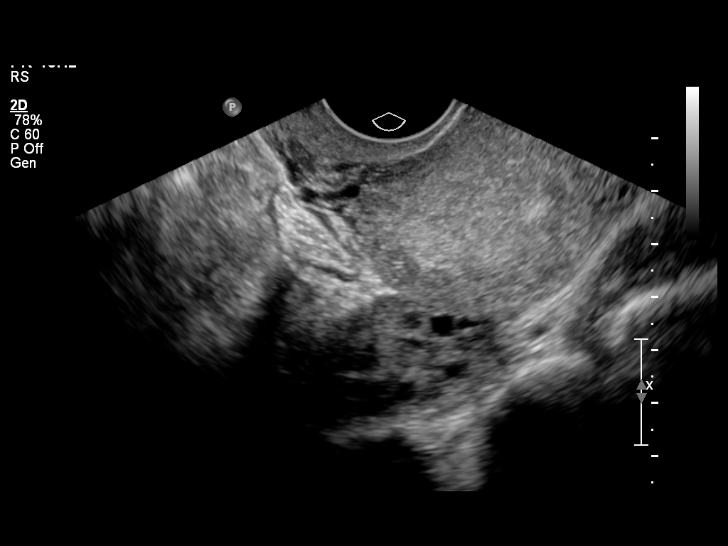
[im 79/106]
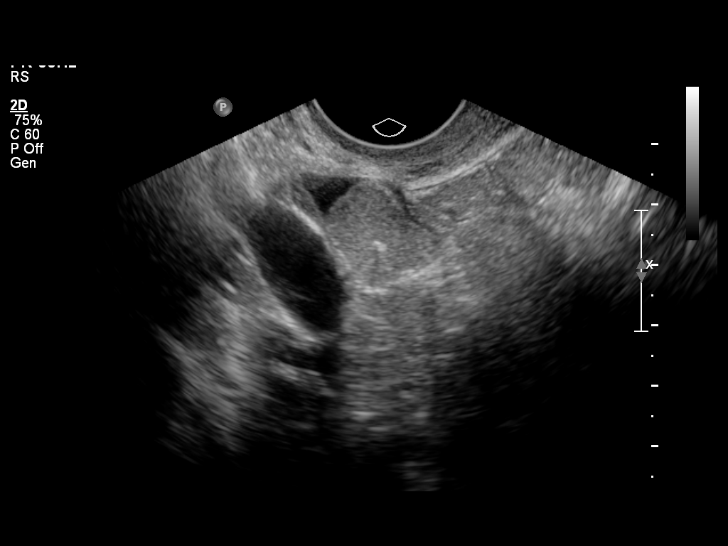
[im 88/106]
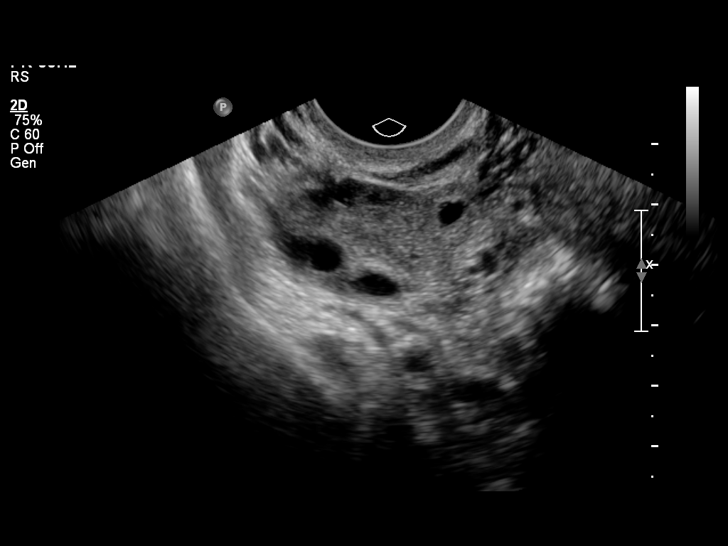
[im 97/106]
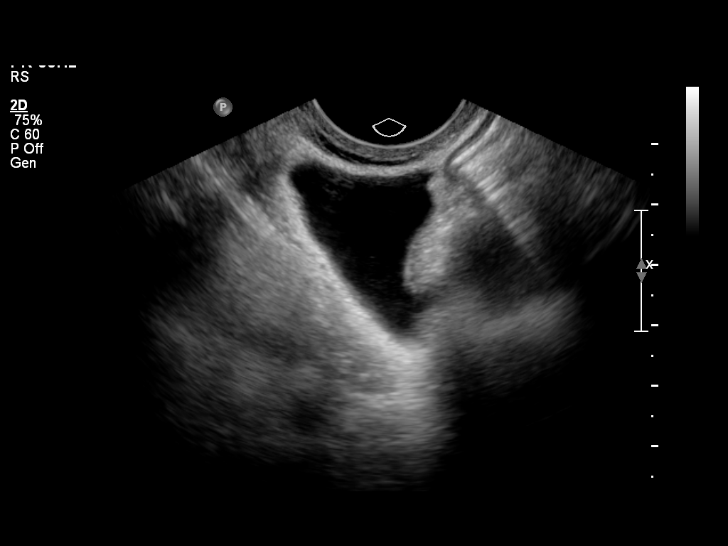
[im 106/106]
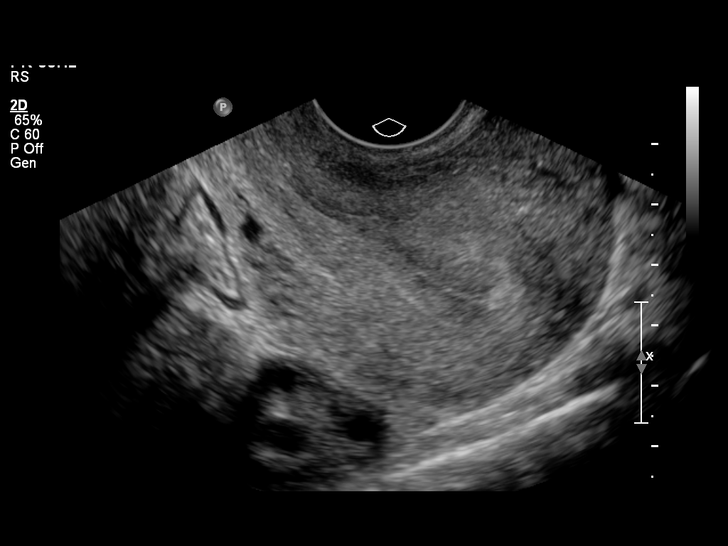

[13 of 25 positions shown; findings below may reference images not displayed]

It was necessary to proceed with endovaginal exam following the
transabdominal exam to visualize the ovaries and adnexa.
FINDINGS: Uterus: Retroflexed.  Normal in size and echogenicity.  Measures
8.0 x 5.0 x 5.9 cm.  No focal mass.

Endometrium: Normal in thickness and appearance.  Measures 11 mm.

Right ovary:  Measures 5.1 x 1.7 x 2.4 cm (ovarian volume 10.8 cm
cubed) and contains multiple small peripheral follicles and
increased echogenic stroma.  No dominant follicle. No ovarian or
adnexal cyst or mass.

Left ovary: Measures 3.6 x 2.3 x 2.6 cm (ovarian volume 12.2 cm
cubed) and contains multiple predominately peripheral small
follicles.  No dominant follicle. No ovarian or adnexal cyst or
mass.

Other findings: A small amount of free fluid is seen in the cul-de-
sac and right adnexa.
IMPRESSION: 1.  Negative for pelvic cyst or solid mass.
2.  Possible PCOS (polycystic ovarian syndrome) by ultrasound.
Suggest correlation with clinical history.
3.  Small amount of free fluid in the cul-de-sac and right adnexa.

## 2012-09-03 ENCOUNTER — Encounter (HOSPITAL_COMMUNITY): Payer: Self-pay | Admitting: *Deleted

## 2012-09-03 ENCOUNTER — Inpatient Hospital Stay (HOSPITAL_COMMUNITY)
Admission: AD | Admit: 2012-09-03 | Discharge: 2012-09-03 | Disposition: A | Discharge status: Home / Self Care | Source: Ambulatory Visit | Attending: Obstetrics and Gynecology | Admitting: Obstetrics and Gynecology

## 2012-09-03 ENCOUNTER — Inpatient Hospital Stay (HOSPITAL_COMMUNITY)

## 2012-09-03 DIAGNOSIS — R109 Unspecified abdominal pain: Secondary | ICD-10-CM | POA: Insufficient documentation

## 2012-09-03 DIAGNOSIS — N83209 Unspecified ovarian cyst, unspecified side: Secondary | ICD-10-CM

## 2012-09-03 LAB — URINALYSIS, ROUTINE W REFLEX MICROSCOPIC
Bilirubin Urine: NEGATIVE
Glucose, UA: NEGATIVE mg/dL
Hgb urine dipstick: NEGATIVE
Ketones, ur: NEGATIVE mg/dL
Leukocytes, UA: NEGATIVE
Nitrite: NEGATIVE
Protein, ur: NEGATIVE mg/dL
Specific Gravity, Urine: 1.025 (ref 1.005–1.030)
Urobilinogen, UA: 0.2 mg/dL (ref 0.0–1.0)
pH: 5.5 (ref 5.0–8.0)

## 2012-09-03 LAB — POCT PREGNANCY, URINE: Preg Test, Ur: NEGATIVE

## 2012-09-03 MED ORDER — NAPROXEN 500 MG PO TABS: 500.0000 mg | ORAL_TABLET | Freq: Two times a day (BID) | ORAL | Status: DC

## 2012-09-03 MED ORDER — MEDROXYPROGESTERONE ACETATE 150 MG/ML IM SUSP
150.0000 mg | Freq: Once | INTRAMUSCULAR | Status: AC
  Administered 2012-09-03: 150 mg via INTRAMUSCULAR
  Filled 2012-09-03: qty 1

## 2012-09-03 MED ORDER — KETOROLAC TROMETHAMINE 60 MG/2ML IM SOLN
60.0000 mg | Freq: Once | INTRAMUSCULAR | Status: AC
  Administered 2012-09-03: 60 mg via INTRAMUSCULAR
  Filled 2012-09-03: qty 2

## 2012-09-03 NOTE — MAU Note (Signed)
Diagnosed with ovarian cyst about 3 weeks ago. Was told it would resolve on its own. Was not given pain medicine for home. Pain returned 4 days ago and has been consistent.

## 2012-09-03 NOTE — MAU Provider Note (Signed)
History     CSN: [id number]  Arrival date and time: 09/03/12 0746   None     Chief Complaint  Patient presents with  . Abdominal Pain   HPI 21 y.o. [id number] with right sided pelvic pain since Thursday. Seen at [facility] ED with hematuria and right sided abd pain 3 weeks ago, saw cyst on ovary on CT scan. No better with tylenol. Had negative cultures and wet prep at [facility]. Pt also interested in Depo Provera for birth control. Had an abortion in August, has not resumed normal periods yet. Does not wish to become pregnant again at this time.     Past Medical History  Diagnosis Date  . Bacterial vaginal infection   . Ovarian cyst   . Chlamydia     Past Surgical History  Procedure Date  . Arm surgery x3   . Wisdom tooth extraction   . Therapeutic abortion     History reviewed. No pertinent family history.  History  Substance Use Topics  . Smoking status: Never Smoker   . Smokeless tobacco: Never Used  . Alcohol Use: No    Allergies: No Known Allergies  No prescriptions prior to admission    Review of Systems  Constitutional: Negative.   Respiratory: Negative.   Cardiovascular: Negative.   Gastrointestinal: Positive for abdominal pain. Negative for nausea, vomiting, diarrhea and constipation.  Genitourinary: Negative for dysuria, urgency, frequency, hematuria and flank pain.       Negative for vaginal bleeding, vaginal discharge, dyspareunia  Musculoskeletal: Negative.   Neurological: Negative.   Psychiatric/Behavioral: Negative.    Physical Exam   Blood pressure 108/73, pulse 61, temperature 98.2 F (36.8 C), temperature source Oral, resp. rate 18, height 5\' 10"  (1.778 m), weight 190 lb 6.4 oz (86.365 kg), last menstrual period 05/06/2012.  Physical Exam  Nursing note and vitals reviewed. Constitutional: She is oriented to person, place, and time. She appears well-developed and well-nourished. No distress.  Cardiovascular: Normal rate.   Respiratory: Effort  normal.  GI: Soft. She exhibits no distension and no mass. There is tenderness (mild, RLQ). There is no rebound and no guarding.  Musculoskeletal: Normal range of motion.  Neurological: She is alert and oriented to person, place, and time.  Skin: Skin is warm and dry.  Psychiatric: She has a normal mood and affect.    MAU Course  Procedures  Results for orders placed during the hospital encounter of 09/03/12 (from the past 24 hour(s))  URINALYSIS, ROUTINE W REFLEX MICROSCOPIC     Status: Normal   Collection Time   09/03/12  7:54 AM      Component Value Range   Color, Urine YELLOW  YELLOW   APPearance CLEAR  CLEAR   Specific Gravity, Urine 1.025  1.005 - 1.030   pH 5.5  5.0 - 8.0   Glucose, UA NEGATIVE  NEGATIVE mg/dL   Hgb urine dipstick NEGATIVE  NEGATIVE   Bilirubin Urine NEGATIVE  NEGATIVE   Ketones, ur NEGATIVE  NEGATIVE mg/dL   Protein, ur NEGATIVE  NEGATIVE mg/dL   Urobilinogen, UA 0.2  0.0 - 1.0 mg/dL   Nitrite NEGATIVE  NEGATIVE   Leukocytes, UA NEGATIVE  NEGATIVE  POCT PREGNANCY, URINE     Status: Normal   Collection Time   09/03/12  8:02 AM      Component Value Range   Preg Test, Ur NEGATIVE  NEGATIVE   Ct Abdomen Pelvis Wo Contrast  08/06/2012  *RADIOLOGY REPORT*  Clinical Data: Hematuria.  Pain.  CT ABDOMEN AND PELVIS WITHOUT CONTRAST  Technique:  Multidetector CT imaging of the abdomen and pelvis was performed following the standard protocol without intravenous contrast.  Comparison: None.  Findings: The liver, spleen, pancreas, adrenal glands, and kidneys are normal.  There is no ureteral dilatation. No renal or ureteral calculi.  Appendix and terminal ileum are normal.  There is a 3.2 cm low density lesion in the right ovary, probably a cyst.  Uterus and left ovary appear normal.  Small amount of free fluid in the pelvis, normal for a female patient of this age.  IMPRESSION: No significant abnormality.   Original Report Authenticated By: Gwynn Burly, M.D.     US Transvaginal Non-ob  09/03/2012  *RADIOLOGY REPORT*  Clinical Data: Right-sided pelvic pain for several days.  Previous right ovarian cyst.  LMP 05/06/2012  TRANSABDOMINAL AND TRANSVAGINAL ULTRASOUND OF PELVIS Technique:  Both transabdominal and transvaginal ultrasound examinations of the pelvis were performed. Transabdominal technique was performed for global imaging of the pelvis including uterus, ovaries, adnexal regions, and pelvic cul-de-sac.  It was necessary to proceed with endovaginal exam following the transabdominal exam to visualize the myometrium, endometrium and adnexa.  Comparison:  CT 07/2012  Findings:  Uterus: Is anteverted and anteflexed and demonstrates a sagittal length of 9.4 cm, depth of 5.8 cm and width of 7.3 cm.  A homogeneous myometrium is seen.  Endometrium: An area of focal heterogeneity is identified within the endometrial canal and best seen with cine loop evaluation in the fundal region measuring 1.5 x 1.3 by 1.1 cm.  This demonstrates a feeding vessel from the right lateral anterior myometrium and this is strongly suspicious for a focal polyp with a submucosal fibroid a secondary consideration given the relatively low overall echotexture.  No other focal endometrial abnormalities are identified  Right ovary:  Measures 6.5 x 4.5 x 5.0 cm and contains a complex cystic lesion measuring 5.2 x 3.7 x 4.4 cm.  This is multiloculated containing multiple thin avascular septations and diffuse echoes within several of the locules. This is new since the prior exam in 01/2012.  This may represent a hemorrhagic cyst but the appearance is not diagnostic and short term follow up is recommended to exclude a neoplastic process. No overtly worrisome features are seen.  Left ovary: Appears normal measuring 3.7 x 2.6 x 2.9 cm  Other findings: A small amount of simple free fluid is noted adjacent to the right ovary extending into the cul-de-sac.  IMPRESSION: Area of focal heterogeneity within the  endometrial canal demonstrating a feeding vessel and suspicious for a focal polyp or submucosal fibroid.  Further assessment with sonohysterography can be performed to help differentiate between these two possibilities.  Complex right ovarian cyst with indeterminate but probably benign features sonographically.  Initial short-term follow-up is recommended in 6-8 weeks to assess for interval change as would be expected with a hemorrhagic cyst and to exclude a neoplastic process.  Normal left ovary.   Original Report Authenticated By: Bertha Stakes, M.D.    US Pelvis Complete  09/03/2012  *RADIOLOGY REPORT*  Clinical Data: Right-sided pelvic pain for several days.  Previous right ovarian cyst.  LMP 05/06/2012  TRANSABDOMINAL AND TRANSVAGINAL ULTRASOUND OF PELVIS Technique:  Both transabdominal and transvaginal ultrasound examinations of the pelvis were performed. Transabdominal technique was performed for global imaging of the pelvis including uterus, ovaries, adnexal regions, and pelvic cul-de-sac.  It was necessary to proceed with endovaginal exam following the transabdominal exam to visualize the myometrium,  endometrium and adnexa.  Comparison:  CT 07/2012  Findings:  Uterus: Is anteverted and anteflexed and demonstrates a sagittal length of 9.4 cm, depth of 5.8 cm and width of 7.3 cm.  A homogeneous myometrium is seen.  Endometrium: An area of focal heterogeneity is identified within the endometrial canal and best seen with cine loop evaluation in the fundal region measuring 1.5 x 1.3 by 1.1 cm.  This demonstrates a feeding vessel from the right lateral anterior myometrium and this is strongly suspicious for a focal polyp with a submucosal fibroid a secondary consideration given the relatively low overall echotexture.  No other focal endometrial abnormalities are identified  Right ovary:  Measures 6.5 x 4.5 x 5.0 cm and contains a complex cystic lesion measuring 5.2 x 3.7 x 4.4 cm.  This is multiloculated  containing multiple thin avascular septations and diffuse echoes within several of the locules. This is new since the prior exam in 01/2012.  This may represent a hemorrhagic cyst but the appearance is not diagnostic and short term follow up is recommended to exclude a neoplastic process. No overtly worrisome features are seen.  Left ovary: Appears normal measuring 3.7 x 2.6 x 2.9 cm  Other findings: A small amount of simple free fluid is noted adjacent to the right ovary extending into the cul-de-sac.  IMPRESSION: Area of focal heterogeneity within the endometrial canal demonstrating a feeding vessel and suspicious for a focal polyp or submucosal fibroid.  Further assessment with sonohysterography can be performed to help differentiate between these two possibilities.  Complex right ovarian cyst with indeterminate but probably benign features sonographically.  Initial short-term follow-up is recommended in 6-8 weeks to assess for interval change as would be expected with a hemorrhagic cyst and to exclude a neoplastic process.  Normal left ovary.   Original Report Authenticated By: Bertha Stakes, M.D.       . ketorolac  60 mg Intramuscular Once  . medroxyPROGESTERone  150 mg Intramuscular Once     Assessment and Plan   1. Ovarian cyst       Medication List     As of 09/03/2012  1:23 PM    START taking these medications         naproxen 500 MG tablet   Commonly known as: NAPROSYN   Take 1 tablet (500 mg total) by mouth 2 (two) times daily with a meal.      CONTINUE taking these medications         Bee Pollen 500 MG Tabs      PROBIOTIC PO          Where to get your medications    These are the prescriptions that you need to pick up. We sent them to a specific pharmacy, so you will need to go there to get them.   [id number] [place], [place] - [address]    [address] [place] [address]    Phone: [phone]        naproxen 500 MG tablet              Follow-up Information    Follow up with [facility]. In 7 weeks. (someone will call to schedule appointment - if no one calls you, you should call to schedule your appointment for after the time that your ultrasound is scheduled)    Contact information:   [address] [place] [address] [phone]      Follow  up with [facility]. In 6 weeks. (for follow up ultrasound)    Contact information:   [address] [id number] mc [place] [address] [phone]           FRAZIER,NATALIE 09/03/2012, 1:23 PM

## 2012-09-03 NOTE — Progress Notes (Signed)
States had an abortion in August and has not resumed normal periods.

## 2012-09-04 NOTE — MAU Provider Note (Signed)
Attestation of Attending Supervision of Advanced Practitioner (CNM/NP): Evaluation and management procedures were performed by the Advanced Practitioner under my supervision and collaboration.  I have reviewed the Advanced Practitioner's note and chart, and I agree with the management and plan.  Jarvin Ogren 09/04/2012 5:30 AM

## 2012-10-15 ENCOUNTER — Ambulatory Visit (HOSPITAL_COMMUNITY)
Admission: RE | Admit: 2012-10-15 | Discharge: 2012-10-15 | Disposition: A | Discharge status: Home / Self Care | Source: Ambulatory Visit | Attending: Obstetrics & Gynecology | Admitting: Obstetrics & Gynecology

## 2012-10-15 DIAGNOSIS — N83209 Unspecified ovarian cyst, unspecified side: Secondary | ICD-10-CM | POA: Insufficient documentation

## 2012-10-17 ENCOUNTER — Ambulatory Visit (INDEPENDENT_AMBULATORY_CARE_PROVIDER_SITE_OTHER): Payer: Self-pay | Admitting: Obstetrics & Gynecology

## 2012-10-17 ENCOUNTER — Encounter: Payer: Self-pay | Admitting: Obstetrics & Gynecology

## 2012-10-17 VITALS — BP 119/80 | HR 62 | Temp 97.3°F | Ht 70.0 in | Wt 184.6 lb

## 2012-10-17 DIAGNOSIS — N83209 Unspecified ovarian cyst, unspecified side: Secondary | ICD-10-CM

## 2012-10-17 NOTE — Progress Notes (Signed)
  Subjective:    Patient ID: Brooke Donovan, female    DOB: May 17, 1991, 21 y.o.   MRN: [id number]  HPI  21 yo S AA [id number] here for follow up of ovarian cyst discovered in the MAU 10/13. A follow up u/s 3 days ago shows interval resolution of cyst. Normal u/s. She says that her pain has resolved. She got the depo provera 09-03-12 and has had irregular vaginal bleeding since then.  Review of Systems Due for pap next year Treated for chlamydia early this year and negative cultures 9/13.    Objective:   Physical Exam  benign     Assessment & Plan:  Ovarian cyst resolved Bleeding with depo- rec IBU prn Schedule annual/pap early next year

## 2012-12-03 ENCOUNTER — Encounter: Payer: Self-pay | Admitting: Obstetrics and Gynecology

## 2012-12-03 ENCOUNTER — Other Ambulatory Visit (HOSPITAL_COMMUNITY)
Admission: RE | Admit: 2012-12-03 | Discharge: 2012-12-03 | Disposition: A | Discharge status: Home / Self Care | Source: Ambulatory Visit | Attending: Obstetrics and Gynecology | Admitting: Obstetrics and Gynecology

## 2012-12-03 ENCOUNTER — Ambulatory Visit (INDEPENDENT_AMBULATORY_CARE_PROVIDER_SITE_OTHER): Admitting: Obstetrics and Gynecology

## 2012-12-03 VITALS — BP 128/73 | HR 73 | Ht 70.0 in | Wt 185.2 lb

## 2012-12-03 DIAGNOSIS — Z113 Encounter for screening for infections with a predominantly sexual mode of transmission: Secondary | ICD-10-CM | POA: Insufficient documentation

## 2012-12-03 DIAGNOSIS — Z01419 Encounter for gynecological examination (general) (routine) without abnormal findings: Secondary | ICD-10-CM

## 2012-12-03 DIAGNOSIS — Z3049 Encounter for surveillance of other contraceptives: Secondary | ICD-10-CM

## 2012-12-03 MED ORDER — MEDROXYPROGESTERONE ACETATE 150 MG/ML IM SUSP
150.0000 mg | Freq: Once | INTRAMUSCULAR | Status: AC
  Administered 2012-12-03: 150 mg via INTRAMUSCULAR

## 2012-12-03 MED ORDER — MEDROXYPROGESTERONE ACETATE 150 MG/ML IM SUSP: 150.0000 mg | INTRAMUSCULAR | Status: DC

## 2012-12-03 NOTE — Progress Notes (Signed)
  Subjective:     Brooke Donovan is a 22 y.o. female and is here for a comprehensive physical exam. The patient reports vaginal spotting since October.  Patient is using Depo-Provera for birth control since October. Patient reports occasional heavier flow at times. Patient had an elective surgical termination of pregnancy in August and had a normal follow-up appointment with provider.  History   Social History  . Marital Status: Single    Spouse Name: N/A    Number of Children: N/A  . Years of Education: N/A   Occupational History  . Not on file.   Social History Main Topics  . Smoking status: Never Smoker   . Smokeless tobacco: Never Used  . Alcohol Use: No  . Drug Use: No  . Sexually Active: Yes    Birth Control/ Protection: Injection     Comment: Last intercourse July. Would like Depo shot.   Other Topics Concern  . Not on file   Social History Narrative  . No narrative on file   Health Maintenance  Topic Date Due  . Chlamydia Screening  07/31/2006  . Pap Smear  07/31/2009  . Tetanus/tdap  07/31/2010  . Influenza Vaccine  07/22/2012       Review of Systems A comprehensive review of systems was negative.   Objective:     GENERAL: Well-developed, well-nourished female in no acute distress.  HEENT: Normocephalic, atraumatic. Sclerae anicteric.  NECK: Supple. Normal thyroid.  LUNGS: Clear to auscultation bilaterally.  HEART: Regular rate and rhythm. BREASTS: Symmetric in size. No palpable masses or lymphadenopathy, skin changes, or nipple drainage. ABDOMEN: Soft, nontender, nondistended. No organomegaly. PELVIC: Normal external female genitalia. Vagina is pink and rugated.  Normal discharge. Normal appearing cervix. Uterus is normal in size. No adnexal mass or tenderness. EXTREMITIES: No cyanosis, clubbing, or edema, 2+ distal pulses.    Assessment:    Healthy female exam.      Plan:    Pap smear with cultures performed Continue depo-provera for birth  control. Patient desires to continue with depo-provera. Informed patient that irregular bleeding may occur following depo-provera initiation. Patient offered OCP in addition to depo-provera but desires to wait until next injection is due. Recommend use of condoms for STD prevention See After Visit Summary for Counseling Recommendations

## 2012-12-03 NOTE — Patient Instructions (Signed)
Preventive Care for Adults, Female A healthy lifestyle and preventive care can promote health and wellness. Preventive health guidelines for women include the following key practices.  A routine yearly physical is a good way to check with your caregiver about your health and preventive screening. It is a chance to share any concerns and updates on your health, and to receive a thorough exam.  Visit your dentist for a routine exam and preventive care every 6 months. Brush your teeth twice a day and floss once a day. Good oral hygiene prevents tooth decay and gum disease.  The frequency of eye exams is based on your age, health, family medical history, use of contact lenses, and other factors. Follow your caregiver's recommendations for frequency of eye exams.  Eat a healthy diet. Foods like vegetables, fruits, whole grains, low-fat dairy products, and lean protein foods contain the nutrients you need without too many calories. Decrease your intake of foods high in solid fats, added sugars, and salt. Eat the right amount of calories for you.Get information about a proper diet from your caregiver, if necessary.  Regular physical exercise is one of the most important things you can do for your health. Most adults should get at least 150 minutes of moderate-intensity exercise (any activity that increases your heart rate and causes you to sweat) each week. In addition, most adults need muscle-strengthening exercises on 2 or more days a week.  Maintain a healthy weight. The body mass index (BMI) is a screening tool to identify possible weight problems. It provides an estimate of body fat based on height and weight. Your caregiver can help determine your BMI, and can help you achieve or maintain a healthy weight.For adults 20 years and older:  A BMI below 18.5 is considered underweight.  A BMI of 18.5 to 24.9 is normal.  A BMI of 25 to 29.9 is considered overweight.  A BMI of 30 and above is  considered obese.  Maintain normal blood lipids and cholesterol levels by exercising and minimizing your intake of saturated fat. Eat a balanced diet with plenty of fruit and vegetables. Blood tests for lipids and cholesterol should begin at age 20 and be repeated every 5 years. If your lipid or cholesterol levels are high, you are over 50, or you are at high risk for heart disease, you may need your cholesterol levels checked more frequently.Ongoing high lipid and cholesterol levels should be treated with medicines if diet and exercise are not effective.  If you smoke, find out from your caregiver how to quit. If you do not use tobacco, do not start.  If you are pregnant, do not drink alcohol. If you are breastfeeding, be very cautious about drinking alcohol. If you are not pregnant and choose to drink alcohol, do not exceed 1 drink per day. One drink is considered to be 12 ounces (355 mL) of beer, 5 ounces (148 mL) of wine, or 1.5 ounces (44 mL) of liquor.  Avoid use of street drugs. Do not share needles with anyone. Ask for help if you need support or instructions about stopping the use of drugs.  High blood pressure causes heart disease and increases the risk of stroke. Your blood pressure should be checked at least every 1 to 2 years. Ongoing high blood pressure should be treated with medicines if weight loss and exercise are not effective.  If you are 55 to 22 years old, ask your caregiver if you should take aspirin to prevent strokes.  Diabetes   screening involves taking a blood sample to check your fasting blood sugar level. This should be done once every 3 years, after age 45, if you are within normal weight and without risk factors for diabetes. Testing should be considered at a younger age or be carried out more frequently if you are overweight and have at least 1 risk factor for diabetes.  Breast cancer screening is essential preventive care for women. You should practice "breast  self-awareness." This means understanding the normal appearance and feel of your breasts and may include breast self-examination. Any changes detected, no matter how small, should be reported to a caregiver. Women in their 20s and 30s should have a clinical breast exam (CBE) by a caregiver as part of a regular health exam every 1 to 3 years. After age 40, women should have a CBE every year. Starting at age 40, women should consider having a mammography (breast X-ray test) every year. Women who have a family history of breast cancer should talk to their caregiver about genetic screening. Women at a high risk of breast cancer should talk to their caregivers about having magnetic resonance imaging (MRI) and a mammography every year.  The Pap test is a screening test for cervical cancer. A Pap test can show cell changes on the cervix that might become cervical cancer if left untreated. A Pap test is a procedure in which cells are obtained and examined from the lower end of the uterus (cervix).  Women should have a Pap test starting at age 21.  Between ages 21 and 29, Pap tests should be repeated every 2 years.  Beginning at age 30, you should have a Pap test every 3 years as long as the past 3 Pap tests have been normal.  Some women have medical problems that increase the chance of getting cervical cancer. Talk to your caregiver about these problems. It is especially important to talk to your caregiver if a new problem develops soon after your last Pap test. In these cases, your caregiver may recommend more frequent screening and Pap tests.  The above recommendations are the same for women who have or have not gotten the vaccine for human papillomavirus (HPV).  If you had a hysterectomy for a problem that was not cancer or a condition that could lead to cancer, then you no longer need Pap tests. Even if you no longer need a Pap test, a regular exam is a good idea to make sure no other problems are  starting.  If you are between ages 65 and 70, and you have had normal Pap tests going back 10 years, you no longer need Pap tests. Even if you no longer need a Pap test, a regular exam is a good idea to make sure no other problems are starting.  If you have had past treatment for cervical cancer or a condition that could lead to cancer, you need Pap tests and screening for cancer for at least 20 years after your treatment.  If Pap tests have been discontinued, risk factors (such as a new sexual partner) need to be reassessed to determine if screening should be resumed.  The HPV test is an additional test that may be used for cervical cancer screening. The HPV test looks for the virus that can cause the cell changes on the cervix. The cells collected during the Pap test can be tested for HPV. The HPV test could be used to screen women aged 30 years and older, and should   be used in women of any age who have unclear Pap test results. After the age of 30, women should have HPV testing at the same frequency as a Pap test.  Colorectal cancer can be detected and often prevented. Most routine colorectal cancer screening begins at the age of 50 and continues through age 75. However, your caregiver may recommend screening at an earlier age if you have risk factors for colon cancer. On a yearly basis, your caregiver may provide home test kits to check for hidden blood in the stool. Use of a small camera at the end of a tube, to directly examine the colon (sigmoidoscopy or colonoscopy), can detect the earliest forms of colorectal cancer. Talk to your caregiver about this at age 50, when routine screening begins. Direct examination of the colon should be repeated every 5 to 10 years through age 75, unless early forms of pre-cancerous polyps or small growths are found.  Hepatitis C blood testing is recommended for all people born from 1945 through 1965 and any individual with known risks for hepatitis C.  Practice  safe sex. Use condoms and avoid high-risk sexual practices to reduce the spread of sexually transmitted infections (STIs). STIs include gonorrhea, chlamydia, syphilis, trichomonas, herpes, HPV, and human immunodeficiency virus (HIV). Herpes, HIV, and HPV are viral illnesses that have no cure. They can result in disability, cancer, and death. Sexually active women aged 25 and younger should be checked for chlamydia. Older women with new or multiple partners should also be tested for chlamydia. Testing for other STIs is recommended if you are sexually active and at increased risk.  Osteoporosis is a disease in which the bones lose minerals and strength with aging. This can result in serious bone fractures. The risk of osteoporosis can be identified using a bone density scan. Women ages 65 and over and women at risk for fractures or osteoporosis should discuss screening with their caregivers. Ask your caregiver whether you should take a calcium supplement or vitamin D to reduce the rate of osteoporosis.  Menopause can be associated with physical symptoms and risks. Hormone replacement therapy is available to decrease symptoms and risks. You should talk to your caregiver about whether hormone replacement therapy is right for you.  Use sunscreen with sun protection factor (SPF) of 30 or more. Apply sunscreen liberally and repeatedly throughout the day. You should seek shade when your shadow is shorter than you. Protect yourself by wearing long sleeves, pants, a wide-brimmed hat, and sunglasses year round, whenever you are outdoors.  Once a month, do a whole body skin exam, using a mirror to look at the skin on your back. Notify your caregiver of new moles, moles that have irregular borders, moles that are larger than a pencil eraser, or moles that have changed in shape or color.  Stay current with required immunizations.  Influenza. You need a dose every fall (or winter). The composition of the flu vaccine  changes each year, so being vaccinated once is not enough.  Pneumococcal polysaccharide. You need 1 to 2 doses if you smoke cigarettes or if you have certain chronic medical conditions. You need 1 dose at age 65 (or older) if you have never been vaccinated.  Tetanus, diphtheria, pertussis (Tdap, Td). Get 1 dose of Tdap vaccine if you are younger than age 65, are over 65 and have contact with an infant, are a healthcare worker, are pregnant, or simply want to be protected from whooping cough. After that, you need a Td   booster dose every 10 years. Consult your caregiver if you have not had at least 3 tetanus and diphtheria-containing shots sometime in your life or have a deep or dirty wound.  HPV. You need this vaccine if you are a woman age 26 or younger. The vaccine is given in 3 doses over 6 months.  Measles, mumps, rubella (MMR). You need at least 1 dose of MMR if you were born in 1957 or later. You may also need a second dose.  Meningococcal. If you are age 19 to 21 and a first-year college student living in a residence hall, or have one of several medical conditions, you need to get vaccinated against meningococcal disease. You may also need additional booster doses.  Zoster (shingles). If you are age 60 or older, you should get this vaccine.  Varicella (chickenpox). If you have never had chickenpox or you were vaccinated but received only 1 dose, talk to your caregiver to find out if you need this vaccine.  Hepatitis A. You need this vaccine if you have a specific risk factor for hepatitis A virus infection or you simply wish to be protected from this disease. The vaccine is usually given as 2 doses, 6 to 18 months apart.  Hepatitis B. You need this vaccine if you have a specific risk factor for hepatitis B virus infection or you simply wish to be protected from this disease. The vaccine is given in 3 doses, usually over 6 months. Preventive Services / Frequency Ages 19 to 39  Blood  pressure check.** / Every 1 to 2 years.  Lipid and cholesterol check.** / Every 5 years beginning at age 20.  Clinical breast exam.** / Every 3 years for women in their 20s and 30s.  Pap test.** / Every 2 years from ages 21 through 29. Every 3 years starting at age 30 through age 65 or 70 with a history of 3 consecutive normal Pap tests.  HPV screening.** / Every 3 years from ages 30 through ages 65 to 70 with a history of 3 consecutive normal Pap tests.  Hepatitis C blood test.** / For any individual with known risks for hepatitis C.  Skin self-exam. / Monthly.  Influenza immunization.** / Every year.  Pneumococcal polysaccharide immunization.** / 1 to 2 doses if you smoke cigarettes or if you have certain chronic medical conditions.  Tetanus, diphtheria, pertussis (Tdap, Td) immunization. / A one-time dose of Tdap vaccine. After that, you need a Td booster dose every 10 years.  HPV immunization. / 3 doses over 6 months, if you are 26 and younger.  Measles, mumps, rubella (MMR) immunization. / You need at least 1 dose of MMR if you were born in 1957 or later. You may also need a second dose.  Meningococcal immunization. / 1 dose if you are age 19 to 21 and a first-year college student living in a residence hall, or have one of several medical conditions, you need to get vaccinated against meningococcal disease. You may also need additional booster doses.  Varicella immunization.** / Consult your caregiver.  Hepatitis A immunization.** / Consult your caregiver. 2 doses, 6 to 18 months apart.  Hepatitis B immunization.** / Consult your caregiver. 3 doses usually over 6 months. ** Family history and personal history of risk and conditions may change your caregiver's recommendations. Document Released: 01/03/2002 Document Revised: 01/30/2012 Document Reviewed: 04/04/2011 [employer] Patient Information 2013 [employer].  

## 2012-12-03 NOTE — Addendum Note (Signed)
Addended by: Soyla Murphy T on: 12/03/2012 02:46 PM   Modules accepted: Orders

## 2012-12-03 NOTE — Addendum Note (Signed)
Addended by: Soyla Murphy T on: 12/03/2012 02:39 PM   Modules accepted: Orders

## 2013-02-03 IMAGING — CT CT ABD-PELV W/O CM
3 of 4 series · 15 of 32 positions shown, 20 images · non-contrast
Comparison: None.

CLINICAL DATA: Hematuria.  Pain.

CT ABDOMEN AND PELVIS WITHOUT CONTRAST
TECHNIQUE: Multidetector CT imaging of the abdomen and pelvis was
performed following the standard protocol without intravenous
contrast.

[Series 2: renal stone · axial · 0.70mm/px · z∈[-345,-105]mm · 4 of 82 slices shown, 9 images]
[im 17/82  soft-tissue]
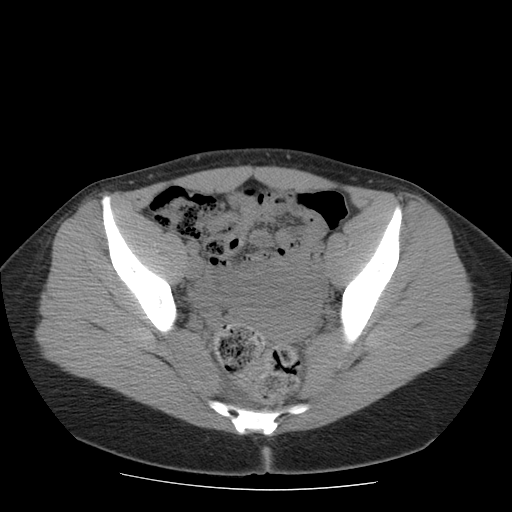
[im 17/82  lung]
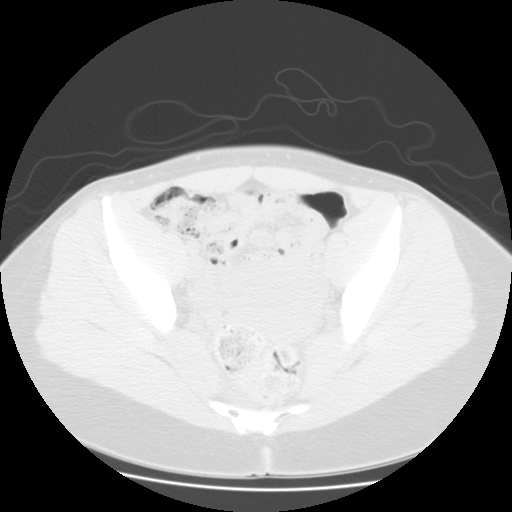
[im 17/82  bone]
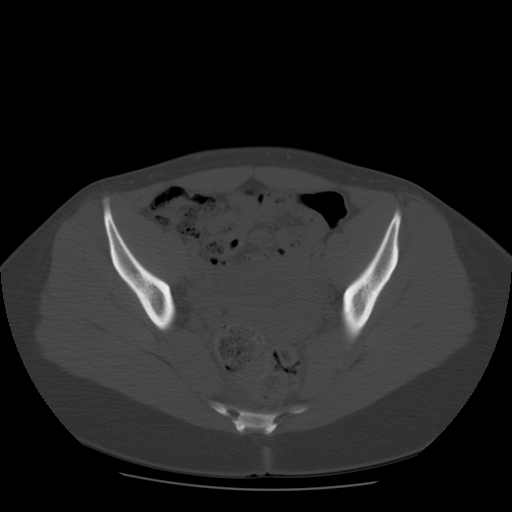
[im 33/82  soft-tissue]
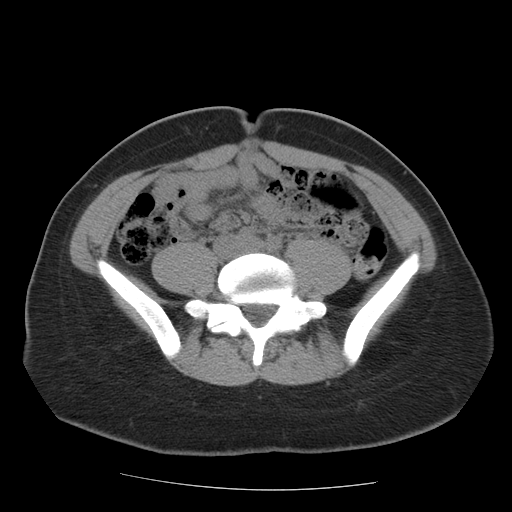
[im 33/82  lung]
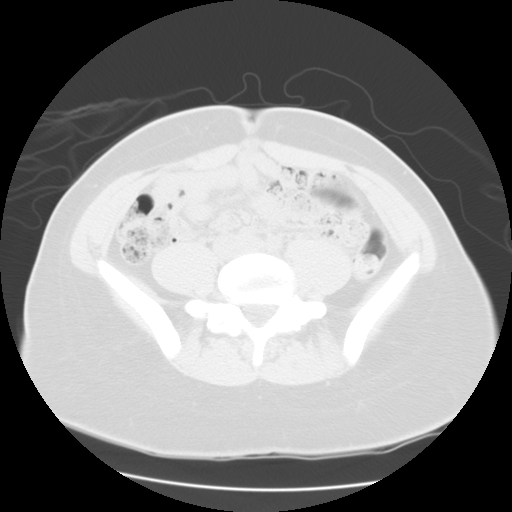
[im 49/82  soft-tissue]
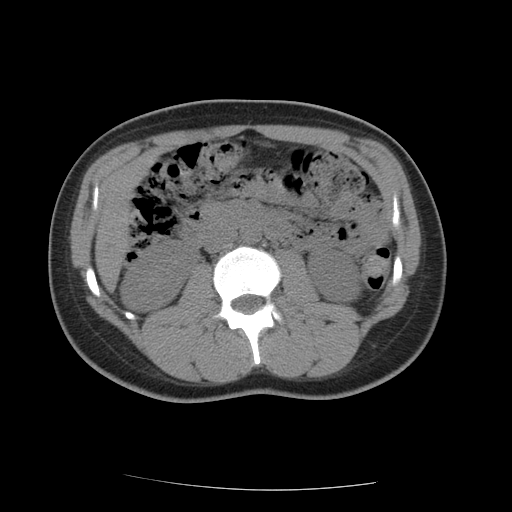
[im 49/82  lung]
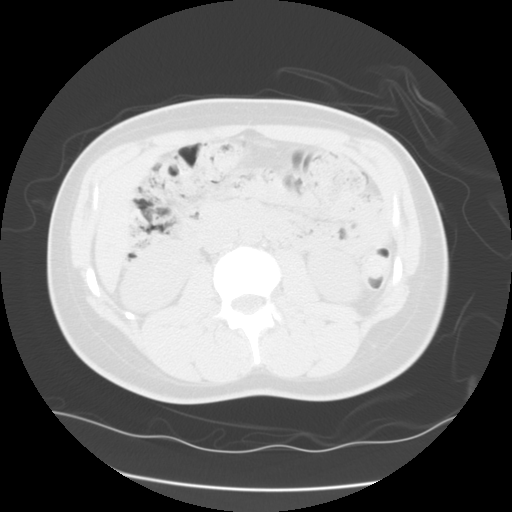
[im 65/82  soft-tissue]
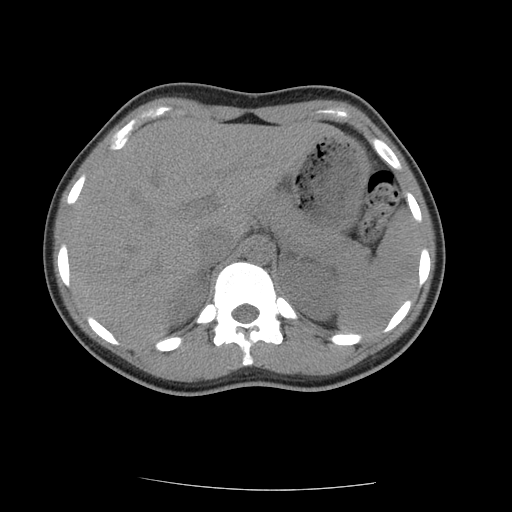
[im 65/82  lung]
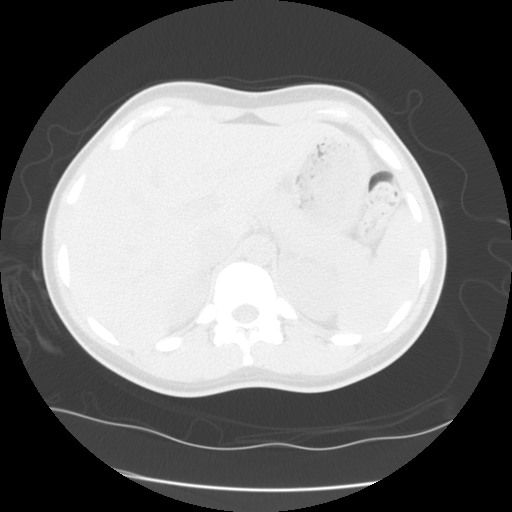

[Series 400: sag · sagittal · 0.91mm/px · 8 of 162 slices shown]
[im 14/162  soft-tissue]
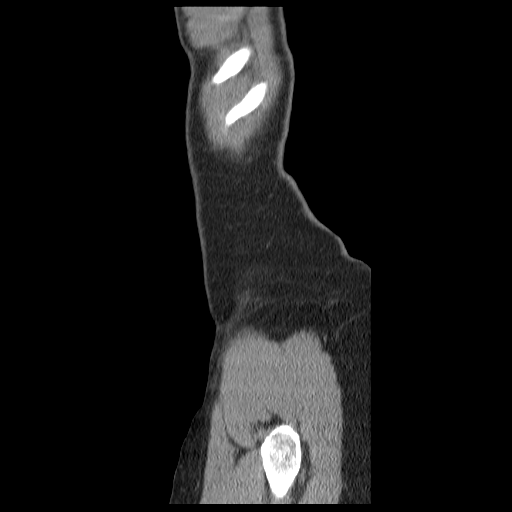
[im 41/162  soft-tissue]
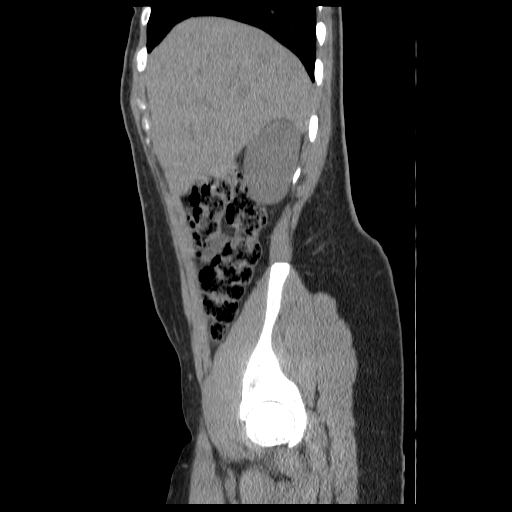
[im 54/162  soft-tissue]
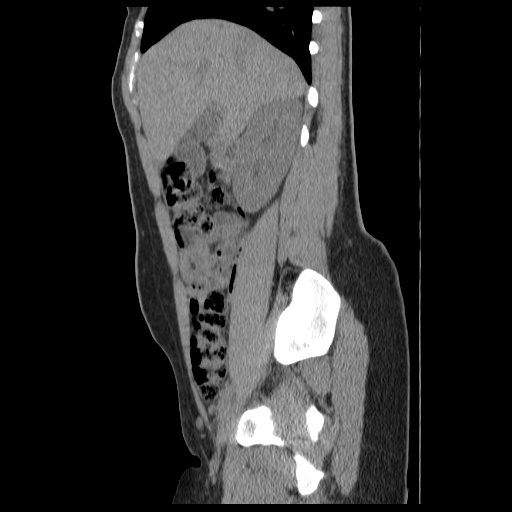
[im 68/162  soft-tissue]
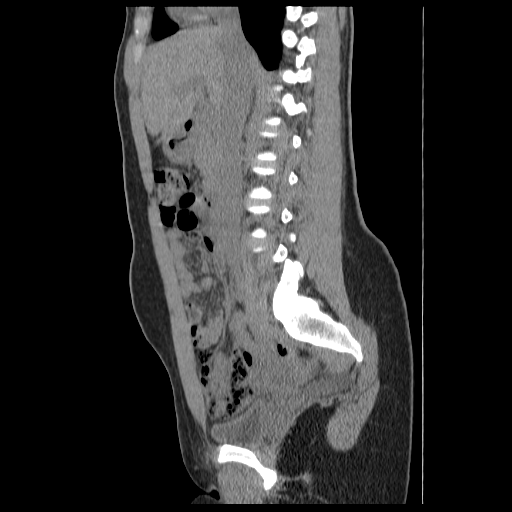
[im 94/162  soft-tissue]
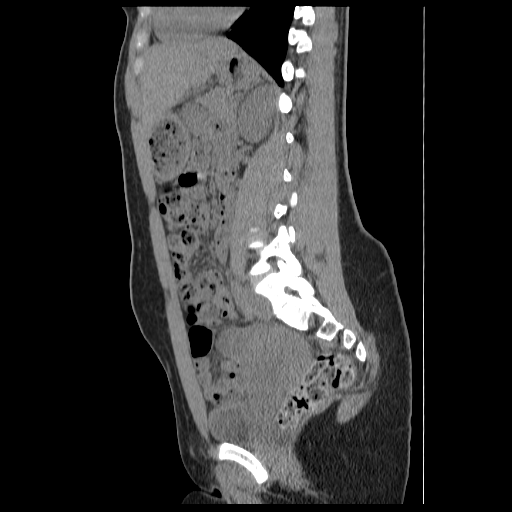
[im 108/162  soft-tissue]
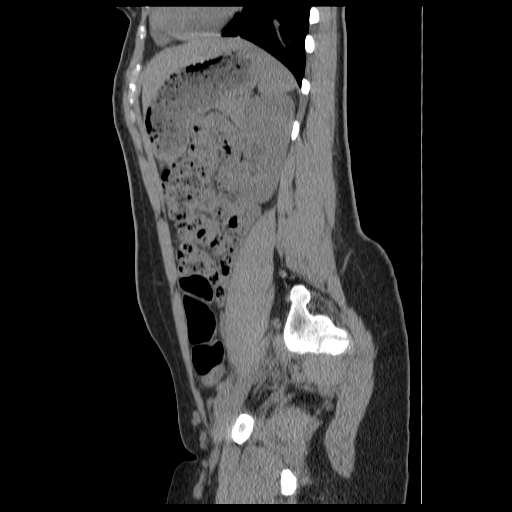
[im 121/162  soft-tissue]
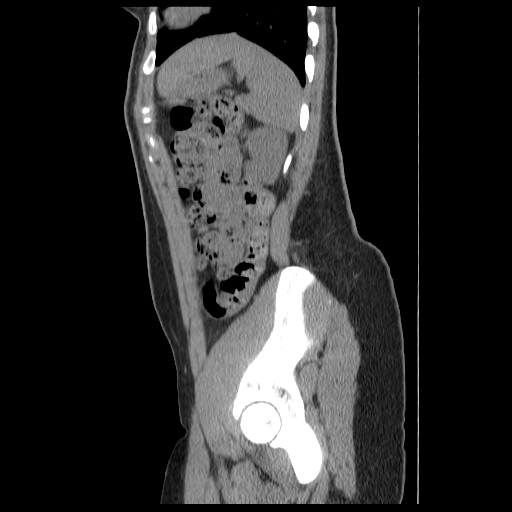
[im 148/162  soft-tissue]
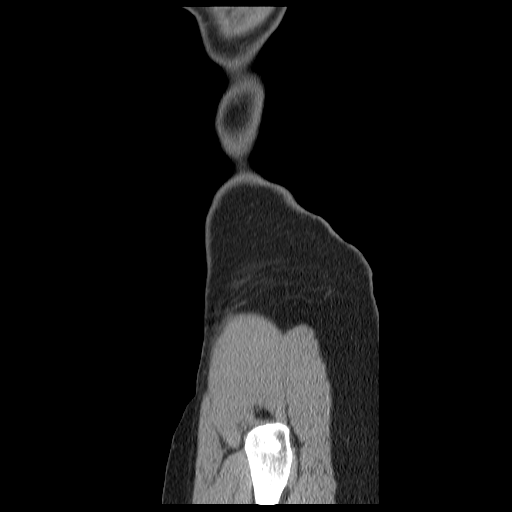

[Series 401: cor · coronal · 0.91mm/px · 3 of 139 slices shown]
[im 14/139  soft-tissue]
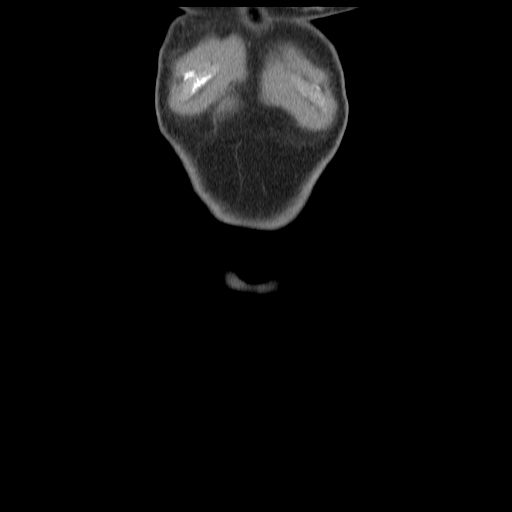
[im 28/139  soft-tissue]
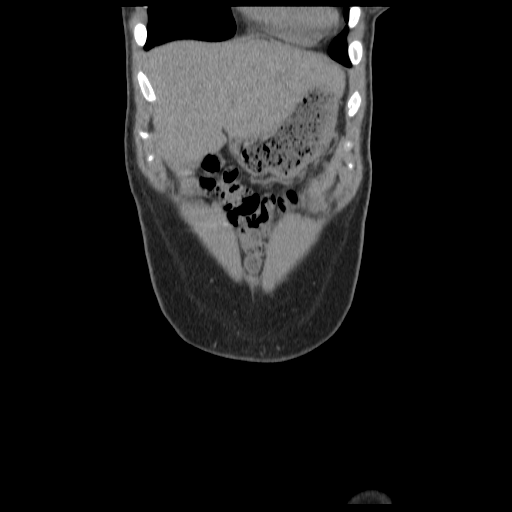
[im 42/139  soft-tissue]
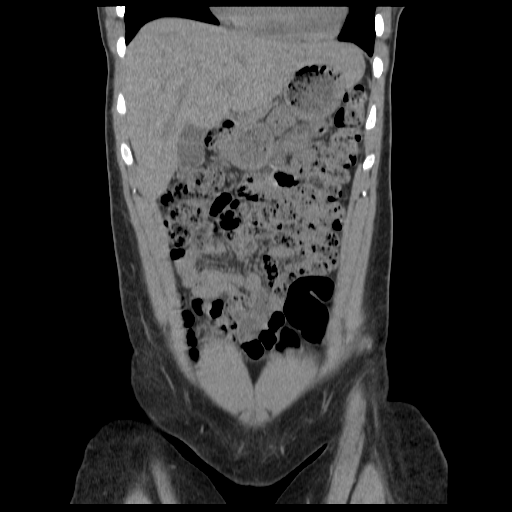

[15 of 32 positions shown; findings below may reference images not displayed]

FINDINGS: The liver, spleen, pancreas, adrenal glands, and kidneys
are normal.  There is no ureteral dilatation. No renal or ureteral
calculi.  Appendix and terminal ileum are normal.  There is a
cm low density lesion in the right ovary, probably a cyst.  Uterus
and left ovary appear normal.  Small amount of free fluid in the
pelvis, normal for a female patient of this age.
IMPRESSION: No significant abnormality.

## 2013-02-28 ENCOUNTER — Ambulatory Visit

## 2013-03-03 IMAGING — US US PELVIS COMPLETE
1 series · 13 of 25 positions shown · non-contrast
Comparison: CT [DATE]

CLINICAL DATA: Right-sided pelvic pain for several days.  Previous
right ovarian cyst.  LMP 05/06/2012



[Series 1: us pelvis complete · 133 acquisitions, 13 frames shown]
[im 1/133]
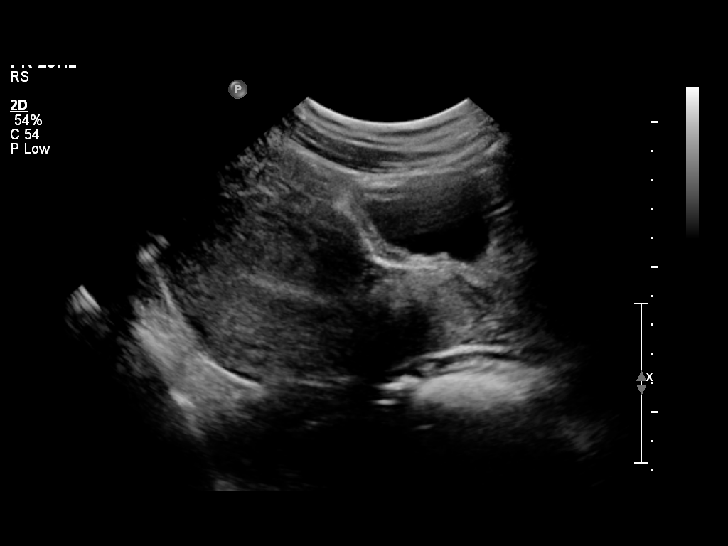
[im 12/133]
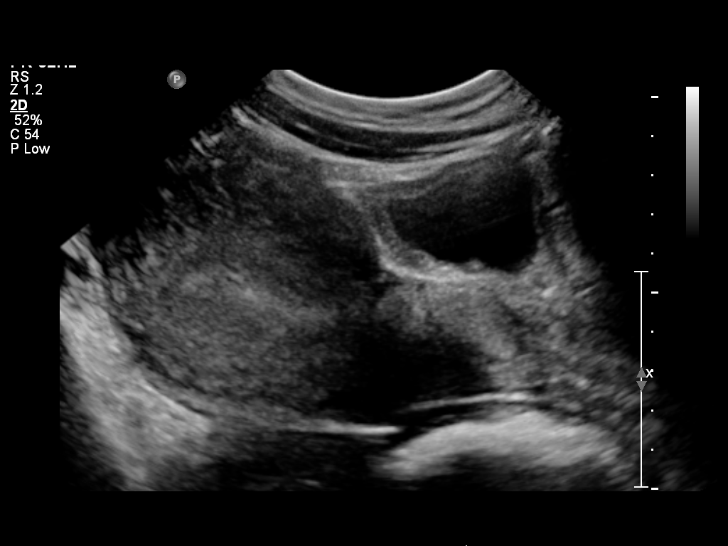
[im 23/133]
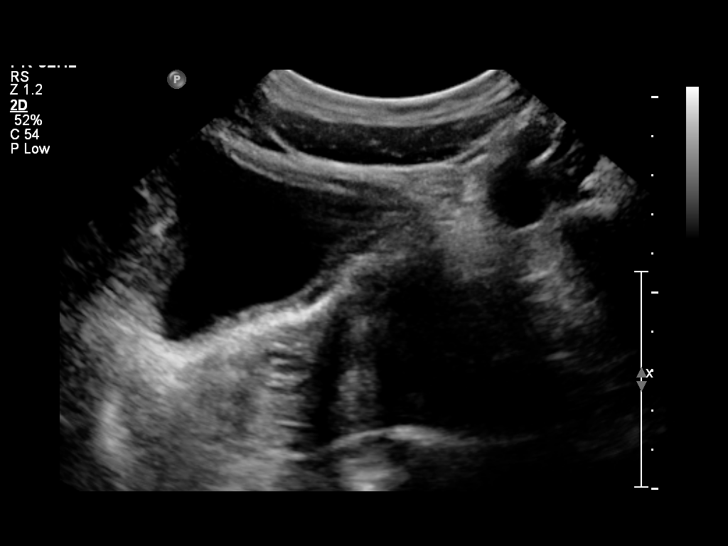
[im 34/133]
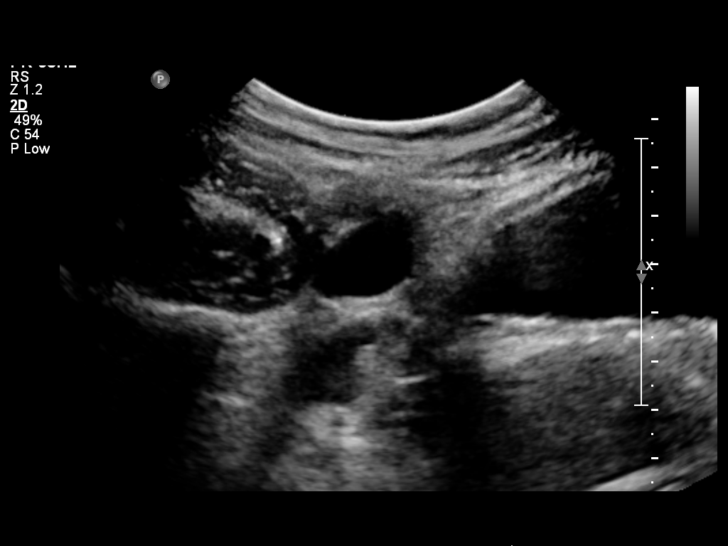
[im 45/133]
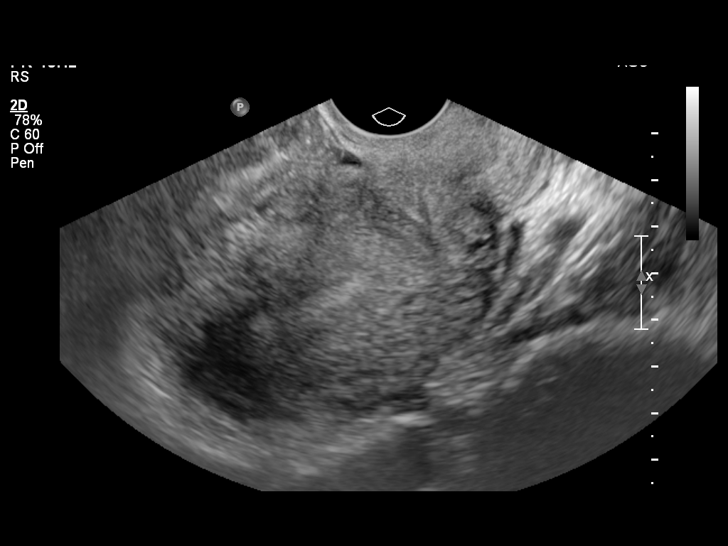
[im 56/133]
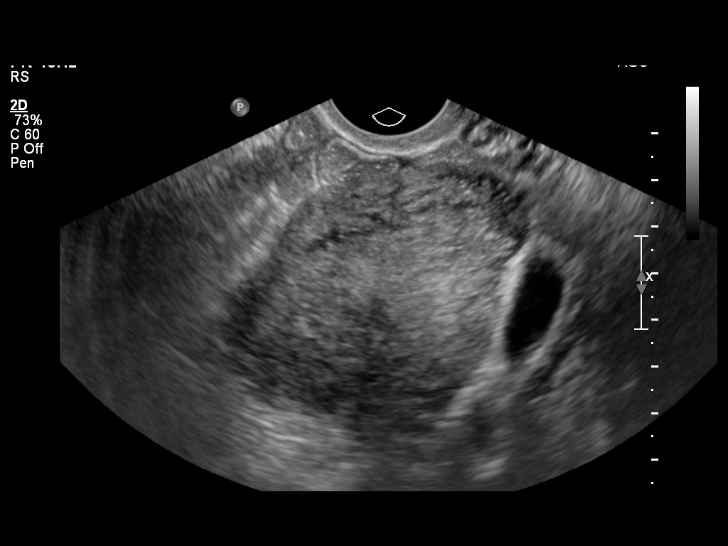
[im 67/133]
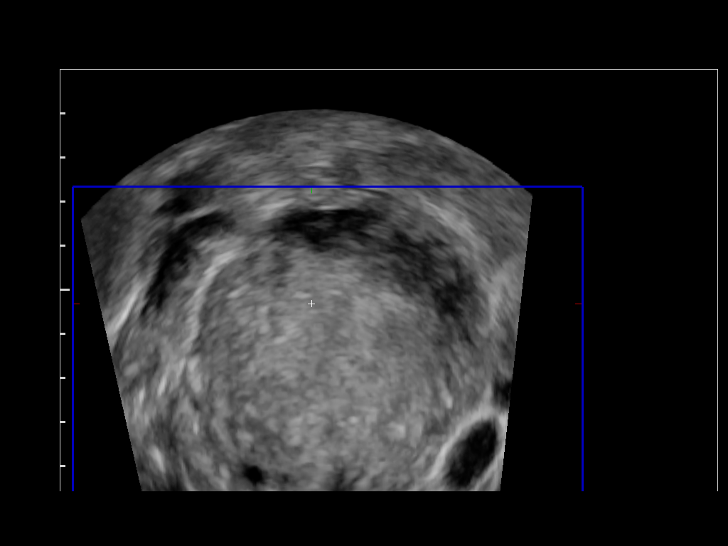
[im 78/133]
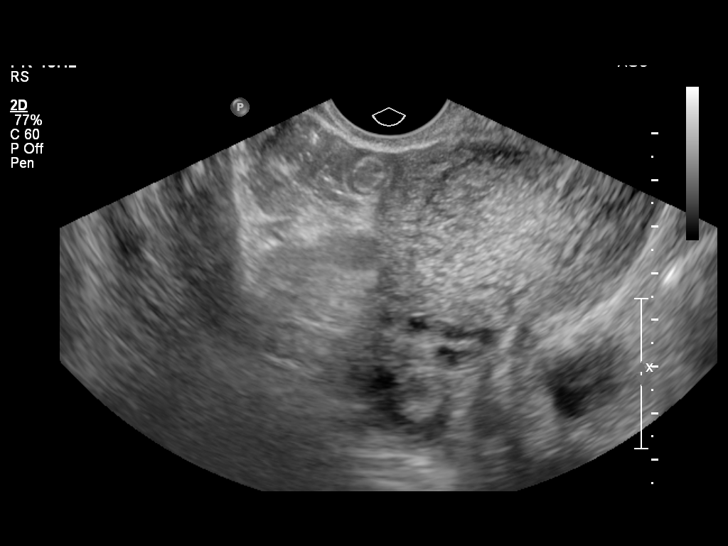
[im 89/133]
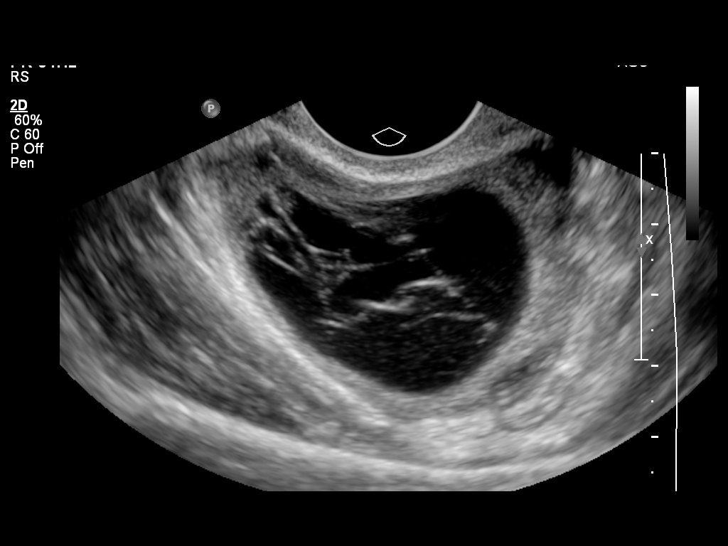
[im 100/133]
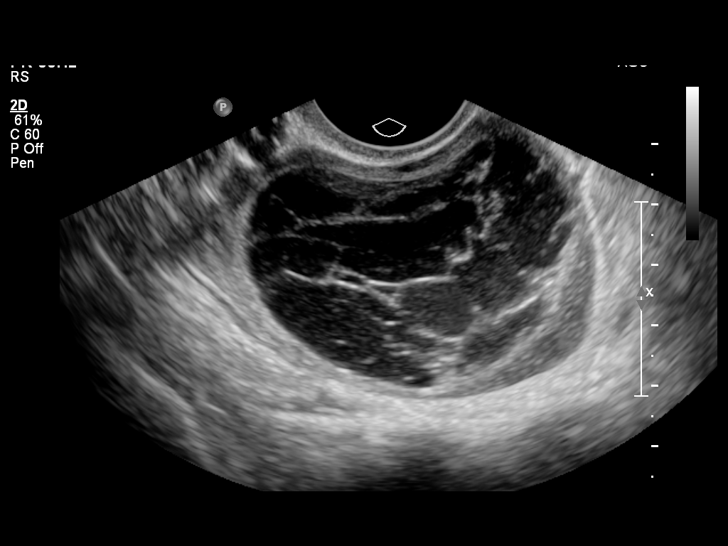
[im 111/133]
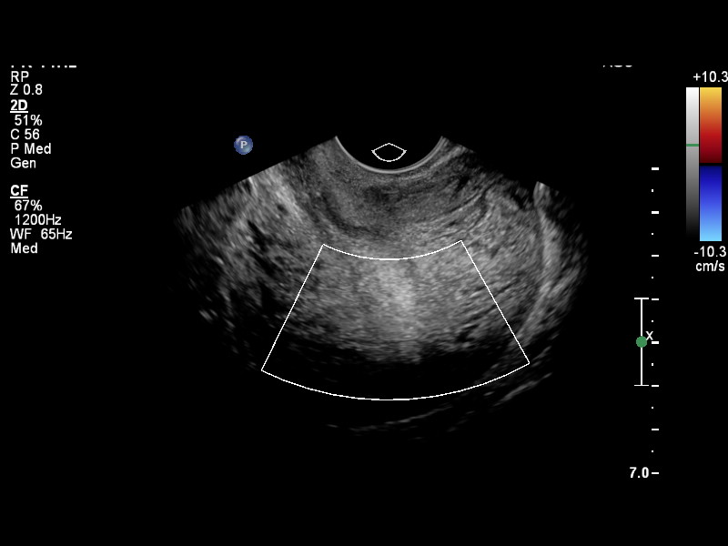
[im 122/133]
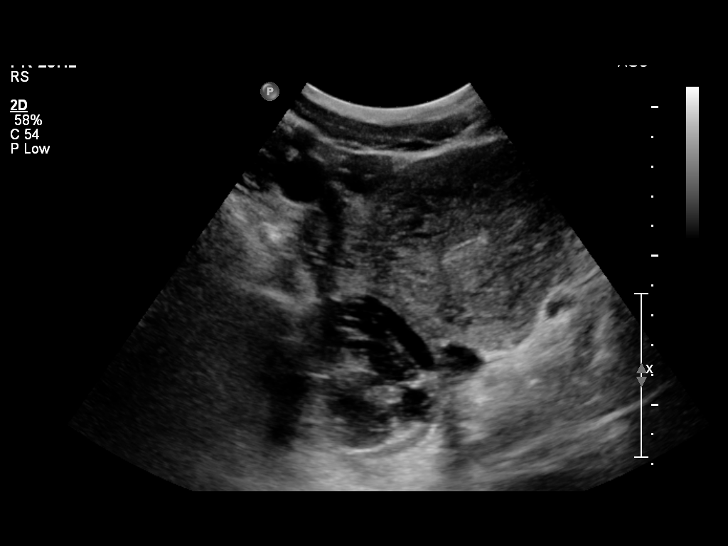
[im 133/133]
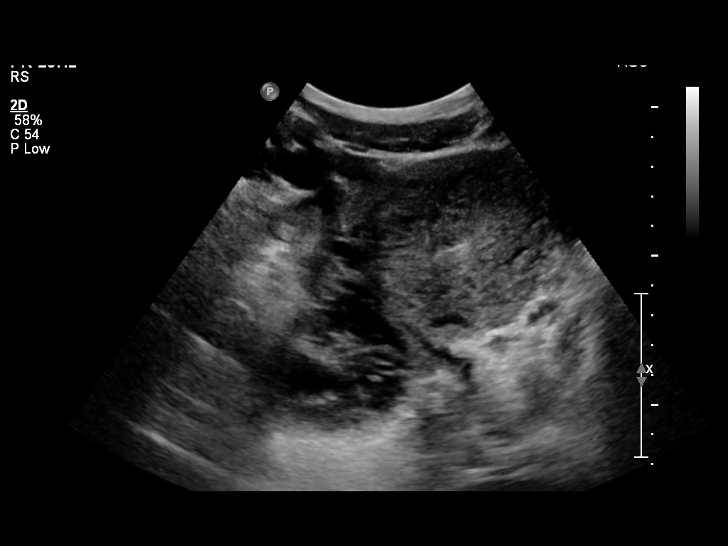

[13 of 25 positions shown; findings below may reference images not displayed]

FINDINGS: Uterus: Is anteverted and anteflexed and demonstrates a sagittal
length of 9.4 cm, depth of 5.8 cm and width of 7.3 cm.  A
homogeneous myometrium is seen.

Endometrium: An area of focal heterogeneity is identified within
the endometrial canal and best seen with cine loop evaluation in
the fundal region measuring 1.5 x 1.3 by 1.1 cm.  This demonstrates
a feeding vessel from the right lateral anterior myometrium and
this is strongly suspicious for a focal polyp with a submucosal
fibroid a secondary consideration given the relatively low overall
echotexture.  No other focal endometrial abnormalities are
identified

Right ovary:  Measures 6.5 x 4.5 x 5.0 cm and contains a complex
cystic lesion measuring 5.2 x 3.7 x 4.4 cm.  This is multiloculated
containing multiple thin avascular septations and diffuse echoes
within several of the locules. This is new since the prior exam in
[DATE].  This may represent a hemorrhagic cyst but the appearance
is not diagnostic and short term follow up is recommended to
exclude a neoplastic process. No overtly worrisome features are
seen.

Left ovary: Appears normal measuring 3.7 x 2.6 x 2.9 cm

Other findings: A small amount of simple free fluid is noted
adjacent to the right ovary extending into the cul-de-sac.
IMPRESSION: Area of focal heterogeneity within the endometrial canal
demonstrating a feeding vessel and suspicious for a focal polyp or
submucosal fibroid.  Further assessment with sonohysterography can
be performed to help differentiate between these two possibilities.

Complex right ovarian cyst with indeterminate but probably benign
features sonographically.  Initial short-term follow-up is
recommended in 6-8 weeks to assess for interval change as would be
expected with a hemorrhagic cyst and to exclude a neoplastic
process.

Normal left ovary.

## 2013-04-14 IMAGING — US US TRANSVAGINAL NON-OB
1 series · 14 of 25 positions shown · non-contrast
Comparison: Pelvic ultrasound 09/03/2012

CLINICAL DATA: Follow-up right ovarian cyst

TRANSVAGINAL ULTRASOUND OF PELVIS
TECHNIQUE: Transvaginal ultrasound examination of the pelvis was
performed including evaluation of the uterus, ovaries, adnexal
regions, and pelvic cul-de-sac.

[Series 1: us transvaginal non-ob · 14 of 43 slices shown]
[im 1/43]
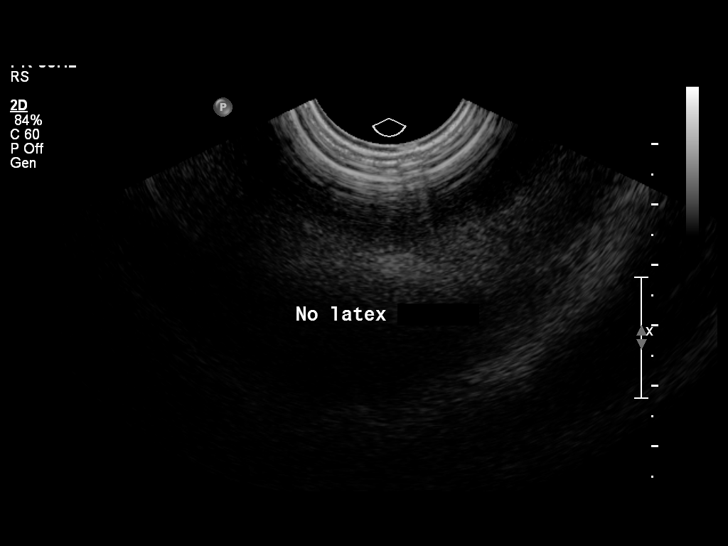
[im 4/43]
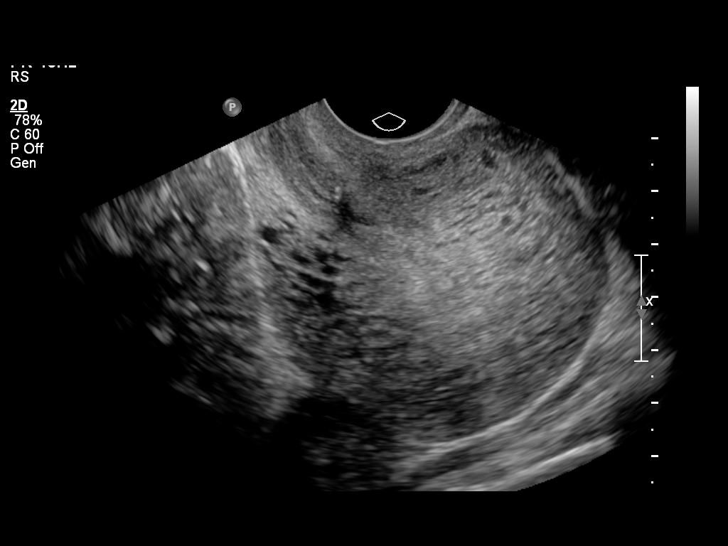
[im 8/43]
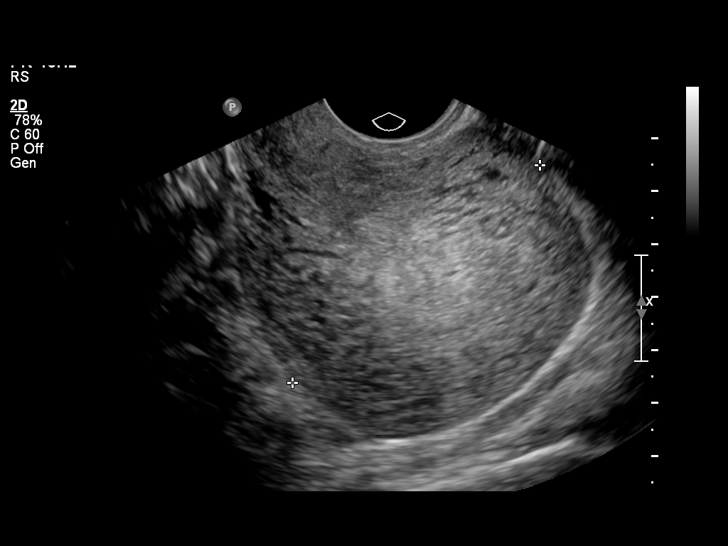
[im 11/43]
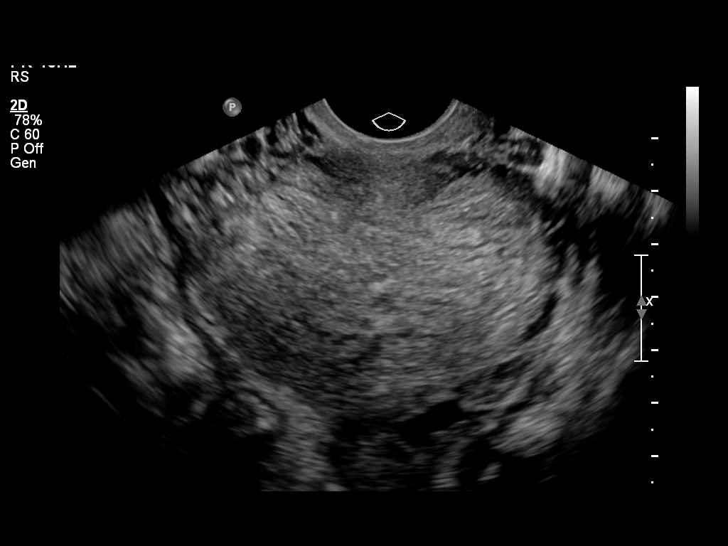
[im 15/43]
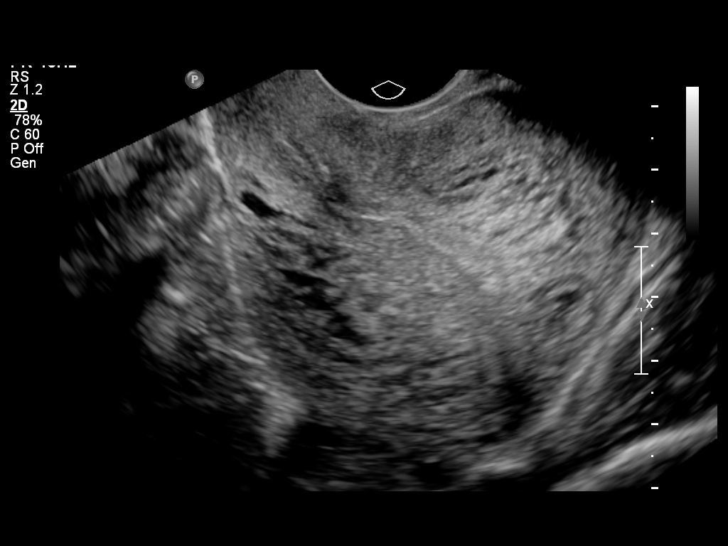
[im 16/43]
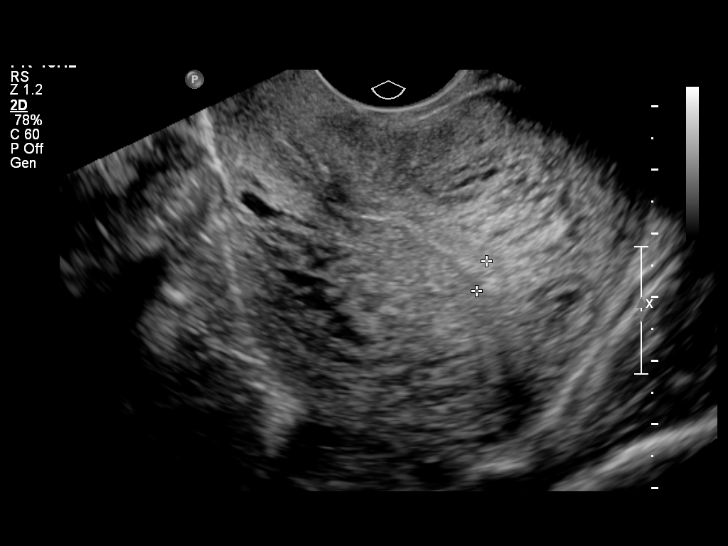
[im 20/43]
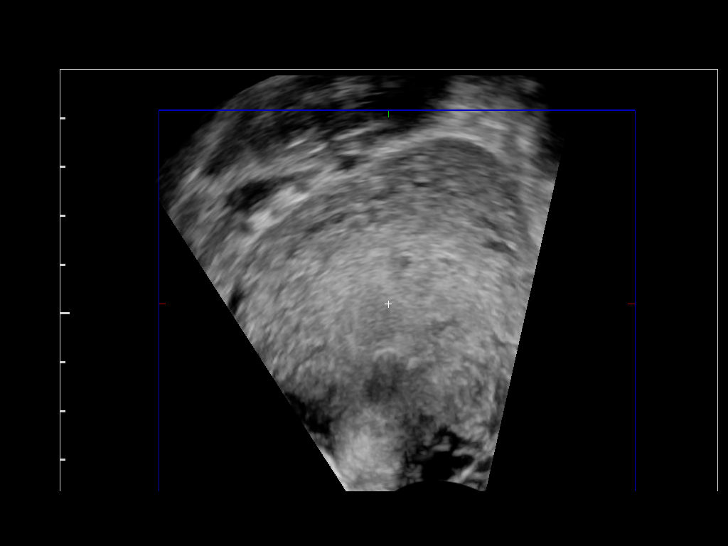
[im 23/43]
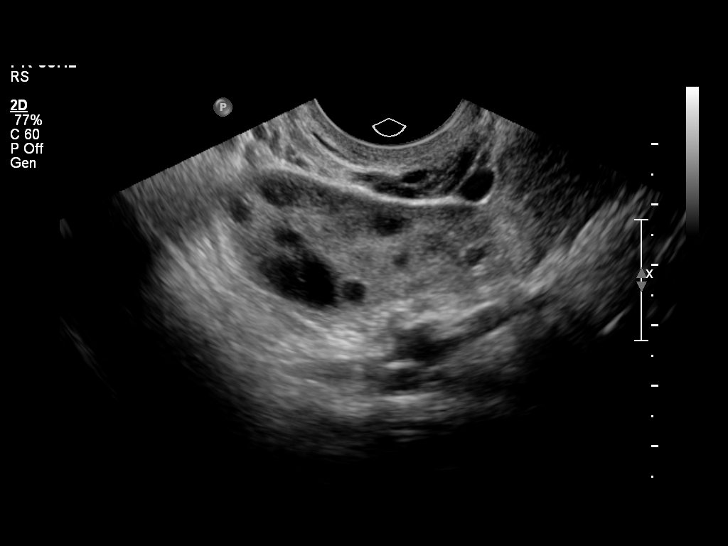
[im 27/43]
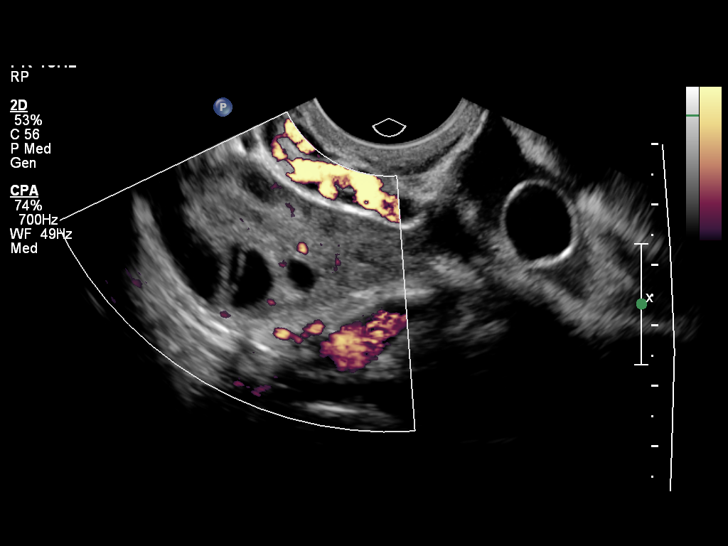
[im 29/43]
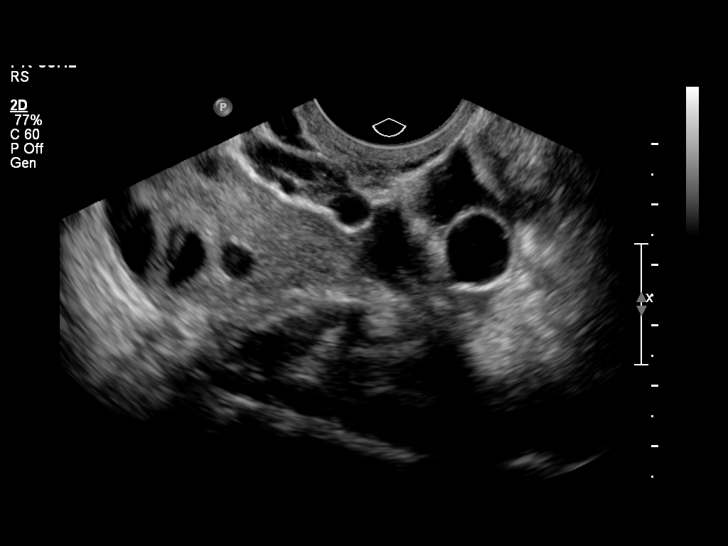
[im 32/43]
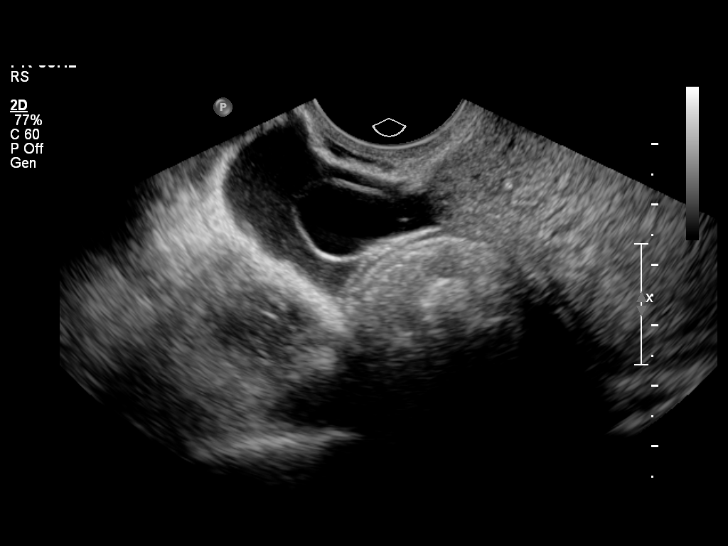
[im 36/43]
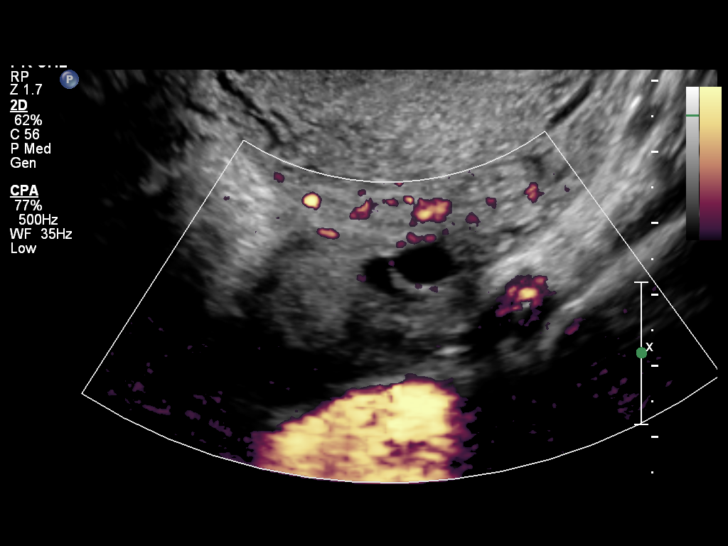
[im 39/43]
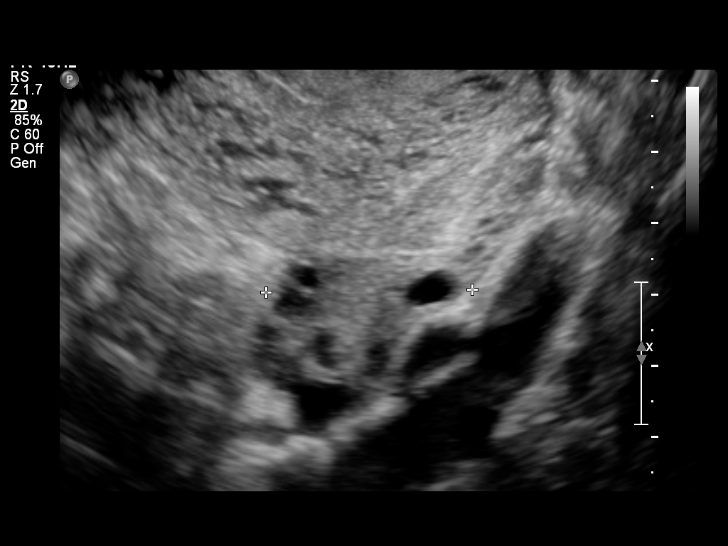
[im 43/43]
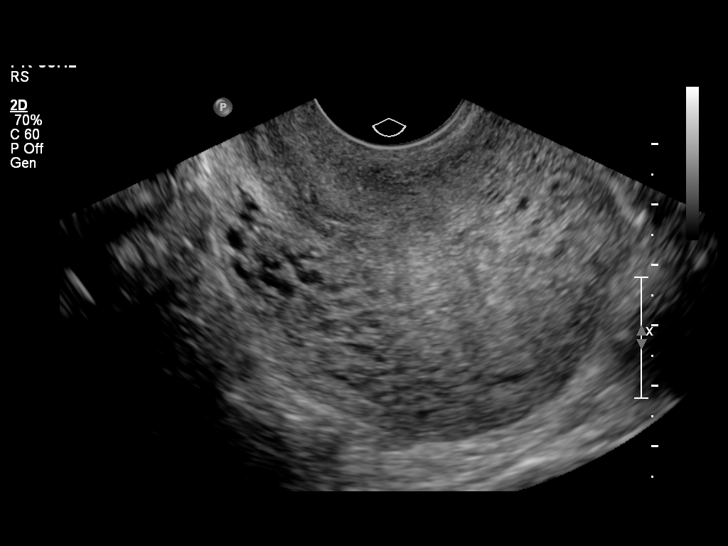

[14 of 25 positions shown; findings below may reference images not displayed]

FINDINGS: Uterus:  Normal in size and appearance.  Measures 8.0 x 6.2 x
cm.

Endometrium: Normal in thickness and appearance.  Measures 5 mm.
No discrete focal area of heterogeneity is identified within the
endometrium on today's ultrasound.

Right ovary: Normal appearance/no adnexal mass.  Measures 5.0 x
x 2.7 cm.  Previously described complex right ovarian cyst has
resolved.

Left ovary: Normal appearance/no adnexal mass

Other Findings:  No free fluid.
IMPRESSION: Ovaries within normal limits on today's exam.  Interval resolution
of previously described complex right ovarian cyst.

## 2013-04-16 ENCOUNTER — Emergency Department (INDEPENDENT_AMBULATORY_CARE_PROVIDER_SITE_OTHER)
Admission: EM | Admit: 2013-04-16 | Discharge: 2013-04-16 | Disposition: A | Discharge status: Home / Self Care | Source: Home / Self Care | Attending: Family Medicine | Admitting: Family Medicine

## 2013-04-16 ENCOUNTER — Encounter (HOSPITAL_COMMUNITY): Payer: Self-pay | Admitting: Emergency Medicine

## 2013-04-16 DIAGNOSIS — M545 Low back pain, unspecified: Secondary | ICD-10-CM

## 2013-04-16 DIAGNOSIS — M5380 Other specified dorsopathies, site unspecified: Secondary | ICD-10-CM

## 2013-04-16 DIAGNOSIS — M62830 Muscle spasm of back: Secondary | ICD-10-CM

## 2013-04-16 LAB — POCT URINALYSIS DIP (DEVICE)
Bilirubin Urine: NEGATIVE
Glucose, UA: NEGATIVE mg/dL
Hgb urine dipstick: NEGATIVE
Ketones, ur: NEGATIVE mg/dL
Leukocytes, UA: NEGATIVE
Nitrite: NEGATIVE
Protein, ur: NEGATIVE mg/dL
Specific Gravity, Urine: 1.02 (ref 1.005–1.030)
Urobilinogen, UA: 0.2 mg/dL (ref 0.0–1.0)
pH: 6 (ref 5.0–8.0)

## 2013-04-16 MED ORDER — METHYLPREDNISOLONE ACETATE 40 MG/ML IJ SUSP
80.0000 mg | Freq: Once | INTRAMUSCULAR | Status: AC
  Administered 2013-04-16: 80 mg via INTRAMUSCULAR

## 2013-04-16 MED ORDER — METHYLPREDNISOLONE ACETATE 80 MG/ML IJ SUSP
INTRAMUSCULAR | Status: AC
  Filled 2013-04-16: qty 1

## 2013-04-16 MED ORDER — IBUPROFEN 800 MG PO TABS: 800.0000 mg | ORAL_TABLET | Freq: Three times a day (TID) | ORAL | Status: DC

## 2013-04-16 MED ORDER — TETANUS-DIPHTH-ACELL PERTUSSIS 5-2.5-18.5 LF-MCG/0.5 IM SUSP
INTRAMUSCULAR | Status: AC
  Filled 2013-04-16: qty 0.5

## 2013-04-16 MED ORDER — CYCLOBENZAPRINE HCL 10 MG PO TABS: 10.0000 mg | ORAL_TABLET | Freq: Three times a day (TID) | ORAL | Status: DC | PRN

## 2013-04-16 NOTE — ED Notes (Signed)
Onset 5/23 of lower right back pain.  Patient reports waking with this pain.  Unknown injury.  Denies urinary symptoms

## 2013-04-16 NOTE — ED Provider Notes (Signed)
History     CSN: [id number]  Arrival date & time 04/16/13  1019   First MD Initiated Contact with Patient 04/16/13 1035      Chief Complaint  Patient presents with  . Back Pain    (Consider location/radiation/quality/duration/timing/severity/associated sxs/prior treatment) HPI Comments: Pt presents c/o sudden onset right-sided lower back pain when she awoke Friday morning.  She thinks this may have been caused by heavy lifting at work but she is unsure.  Pt states the pain is made worse by changes in position but it loosens up she she gets up and walks around.  When she sits still for an extended period of time, her entire lower back starts to feel very tight.  She has never had this before.  She tried taking 500 mg naproxen BID for 1.5 days, the day it started and the next morning, but these did not help.  She denies any urinary symptoms, fever/chills, NVD.    Patient is a 22 y.o. female presenting with back pain.  Back Pain Associated symptoms: no abdominal pain, no chest pain, no dysuria, no fever and no weakness     Past Medical History  Diagnosis Date  . Bacterial vaginal infection   . Ovarian cyst   . Chlamydia     2013    Past Surgical History  Procedure Laterality Date  . Arm surgery x3    . Wisdom tooth extraction    . Therapeutic abortion      No family history on file.  History  Substance Use Topics  . Smoking status: Never Smoker   . Smokeless tobacco: Never Used  . Alcohol Use: No    OB History   Grav Para Term Preterm Abortions TAB SAB Ect Mult Living   2    2 2     0      Review of Systems  Constitutional: Negative for fever and chills.  Eyes: Negative for visual disturbance.  Respiratory: Negative for cough and shortness of breath.   Cardiovascular: Negative for chest pain, palpitations and leg swelling.  Gastrointestinal: Negative for nausea, vomiting and abdominal pain.  Endocrine: Negative for polydipsia and polyuria.  Genitourinary:  Negative for dysuria, urgency and frequency.  Musculoskeletal: Positive for back pain. Negative for myalgias and arthralgias.  Skin: Negative for rash.  Neurological: Negative for dizziness, weakness and light-headedness.    Allergies  Review of patient's allergies indicates no known allergies.  Home Medications   Current Outpatient Rx  Name  Route  Sig  Dispense  Refill  . norethindrone-ethinyl estradiol (TRIPHASIL,CYCLAFEM,ALYACEN) 0.5/0.75/1-35 MG-MCG tablet   Oral   Take 1 tablet by mouth daily.         Alphonsus Sias Pollen 500 MG TABS   Oral   Take 2 tablets by mouth daily.         . cyclobenzaprine (FLEXERIL) 10 MG tablet   Oral   Take 1 tablet (10 mg total) by mouth 3 (three) times daily as needed for muscle spasms.   30 tablet   0     Dispense as written.   Marland Kitchen ibuprofen (ADVIL,MOTRIN) 800 MG tablet   Oral   Take 1 tablet (800 mg total) by mouth 3 (three) times daily.   60 tablet   0   . medroxyPROGESTERone (DEPO-PROVERA) 150 MG/ML injection   Intramuscular   Inject 150 mg into the muscle every 3 (three) months. Last dose 09/03/12         . naproxen (NAPROSYN) 500 MG tablet  Oral   Take 1 tablet (500 mg total) by mouth 2 (two) times daily with a meal.   60 tablet   2     BP 135/79  Pulse 71  Temp(Src) 98.8 F (37.1 C) (Other (Comment))  Resp 17  SpO2 99%  LMP 04/11/2013  Physical Exam  Nursing note and vitals reviewed. Constitutional: She is oriented to person, place, and time. Vital signs are normal. She appears well-developed and well-nourished. No distress.  HENT:  Head: Atraumatic.  Eyes: EOM are normal. Pupils are equal, round, and reactive to light.  Cardiovascular: Normal rate, regular rhythm and normal heart sounds.  Exam reveals no gallop and no friction rub.   No murmur heard. Pulmonary/Chest: Effort normal and breath sounds normal. No respiratory distress. She has no wheezes. She has no rales.  Abdominal: Soft. There is no tenderness.   Musculoskeletal:       Lumbar back: She exhibits decreased range of motion (stiffness but not actual decreased ROM), tenderness (mild along right flank/lumbar region ), pain and spasm (mild on right lumbar region). She exhibits no bony tenderness and no deformity.  Neurological: She is alert and oriented to person, place, and time. She has normal strength.  Skin: Skin is warm and dry. She is not diaphoretic.  Psychiatric: She has a normal mood and affect. Her behavior is normal. Judgment normal.    ED Course  Procedures (including critical care time)  Labs Reviewed  POCT URINALYSIS DIP (DEVICE)   No results found.   1. Lumbago   2. Muscle spasm of back       MDM   Meds ordered this encounter  Medications  .       . methylPREDNISolone acetate (DEPO-MEDROL) injection 80 mg    Sig:   . ibuprofen (ADVIL,MOTRIN) 800 MG tablet    Sig: Take 1 tablet (800 mg total) by mouth 3 (three) times daily.    Dispense:  60 tablet    Refill:  0  . cyclobenzaprine (FLEXERIL) 10 MG tablet    Sig: Take 1 tablet (10 mg total) by mouth 3 (three) times daily as needed for muscle spasms.    Dispense:  30 tablet    Refill:  0   UA is negative.  Will treat for musculoskeletal back pain.  Instructed pt to stay active.  Pt will f/u if not improving within a week         Graylon Good, PA-C 04/16/13 1139

## 2013-04-17 NOTE — ED Provider Notes (Signed)
Medical screening examination/treatment/procedure(s) were performed by resident physician or non-physician practitioner and as supervising physician I was immediately available for consultation/collaboration.   Jermain Curt DOUGLAS MD.   Abiageal Blowe D Solana Coggin, MD 04/17/13 1444 

## 2013-06-03 ENCOUNTER — Encounter (HOSPITAL_COMMUNITY): Payer: Self-pay | Admitting: *Deleted

## 2013-06-03 ENCOUNTER — Inpatient Hospital Stay (HOSPITAL_COMMUNITY)
Admission: AD | Admit: 2013-06-03 | Discharge: 2013-06-03 | Disposition: A | Discharge status: Home / Self Care | Source: Ambulatory Visit | Attending: Obstetrics & Gynecology | Admitting: Obstetrics & Gynecology

## 2013-06-03 DIAGNOSIS — L293 Anogenital pruritus, unspecified: Secondary | ICD-10-CM | POA: Insufficient documentation

## 2013-06-03 DIAGNOSIS — A499 Bacterial infection, unspecified: Secondary | ICD-10-CM

## 2013-06-03 DIAGNOSIS — N760 Acute vaginitis: Secondary | ICD-10-CM | POA: Insufficient documentation

## 2013-06-03 DIAGNOSIS — B9689 Other specified bacterial agents as the cause of diseases classified elsewhere: Secondary | ICD-10-CM | POA: Insufficient documentation

## 2013-06-03 HISTORY — DX: Unspecified abnormal cytological findings in specimens from cervix uteri: R87.619

## 2013-06-03 HISTORY — DX: Reserved for concepts with insufficient information to code with codable children: IMO0002

## 2013-06-03 LAB — URINALYSIS, ROUTINE W REFLEX MICROSCOPIC
Bilirubin Urine: NEGATIVE
Glucose, UA: NEGATIVE mg/dL
Hgb urine dipstick: NEGATIVE
Ketones, ur: NEGATIVE mg/dL
Leukocytes, UA: NEGATIVE
Nitrite: NEGATIVE
Protein, ur: NEGATIVE mg/dL
Specific Gravity, Urine: >1.03 — ABNORMAL HIGH (ref 1.005–1.030)
Urobilinogen, UA: 0.2 mg/dL (ref 0.0–1.0)
pH: 6 (ref 5.0–8.0)

## 2013-06-03 LAB — WET PREP, GENITAL
Trich, Wet Prep: NONE SEEN
Yeast Wet Prep HPF POC: NONE SEEN

## 2013-06-03 LAB — POCT PREGNANCY, URINE: Preg Test, Ur: NEGATIVE

## 2013-06-03 MED ORDER — FLUCONAZOLE 150 MG PO TABS: 150.0000 mg | ORAL_TABLET | Freq: Once | ORAL | Status: DC

## 2013-06-03 MED ORDER — METRONIDAZOLE 500 MG PO TABS: 500.0000 mg | ORAL_TABLET | Freq: Two times a day (BID) | ORAL | Status: DC

## 2013-06-03 NOTE — MAU Provider Note (Signed)
History     CSN: [id number]  Arrival date and time: 06/03/13 4098   First Provider Initiated Contact with Patient 06/03/13 1029      Chief Complaint  Patient presents with  . Vaginal Itching   HPI  22 yo G2P0020 with history of chlamydia and PID presents today with vaginal itching with discharge for 1-2 weeks.  Reports discharge has yellow tinge and thicker than usual. There is vaginal itching and has intermittent dysuria since Friday.  Denies fever, chills, vaginal bleeding, discharge odor, douching, or recent history of antibiotics.     Past Medical History  Diagnosis Date  . Bacterial vaginal infection   . Ovarian cyst   . Chlamydia     2013  . Abnormal Pap smear     Past Surgical History  Procedure Laterality Date  . Arm surgery x3    . Wisdom tooth extraction    . Therapeutic abortion      Family History  Problem Relation Age of Onset  . Hypertension Mother   . Cancer Paternal Grandmother     colon  . Diabetes Neg Hx   . Heart disease Neg Hx   . Hearing loss Neg Hx   . Stroke Neg Hx     History  Substance Use Topics  . Smoking status: Never Smoker   . Smokeless tobacco: Never Used  . Alcohol Use: No    Allergies: No Known Allergies  Prescriptions prior to admission  Medication Sig Dispense Refill  . ibuprofen (ADVIL,MOTRIN) 800 MG tablet Take 1 tablet (800 mg total) by mouth 3 (three) times daily.  60 tablet  0  . norethindrone-ethinyl estradiol (TRIPHASIL,CYCLAFEM,ALYACEN) 0.5/0.75/1-35 MG-MCG tablet Take 1 tablet by mouth daily.        Review of Systems  Constitutional: Negative for fever and chills.  Genitourinary: Positive for dysuria. Negative for flank pain.   Physical Exam   Blood pressure 129/74, pulse 87, temperature 98.4 F (36.9 C), temperature source Oral, resp. rate 18, height 5\' 10"  (1.778 m), last menstrual period 05/16/2013.  Physical Exam  Vitals reviewed. Constitutional: She is oriented to person, place, and time. She  appears well-developed and well-nourished. No distress.  HENT:  Head: Normocephalic and atraumatic.  Cardiovascular: Normal rate, regular rhythm and normal heart sounds.   Respiratory: Effort normal and breath sounds normal. No respiratory distress.  GI: Soft. She exhibits no distension. There is no tenderness.  Neurological: She is alert and oriented to person, place, and time.  Skin: Skin is warm and dry.    Results for orders placed during the hospital encounter of 06/03/13 (from the past 24 hour(s))  URINALYSIS, ROUTINE W REFLEX MICROSCOPIC     Status: Abnormal   Collection Time    06/03/13  9:50 AM      Result Value Range   Color, Urine YELLOW  YELLOW   APPearance CLEAR  CLEAR   Specific Gravity, Urine >1.030 (*) 1.005 - 1.030   pH 6.0  5.0 - 8.0   Glucose, UA NEGATIVE  NEGATIVE mg/dL   Hgb urine dipstick NEGATIVE  NEGATIVE   Bilirubin Urine NEGATIVE  NEGATIVE   Ketones, ur NEGATIVE  NEGATIVE mg/dL   Protein, ur NEGATIVE  NEGATIVE mg/dL   Urobilinogen, UA 0.2  0.0 - 1.0 mg/dL   Nitrite NEGATIVE  NEGATIVE   Leukocytes, UA NEGATIVE  NEGATIVE  POCT PREGNANCY, URINE     Status: None   Collection Time    06/03/13 10:08 AM  Result Value Range   Preg Test, Ur NEGATIVE  NEGATIVE   Results for orders placed during the hospital encounter of 06/03/13 (from the past 24 hour(s))  URINALYSIS, ROUTINE W REFLEX MICROSCOPIC     Status: Abnormal   Collection Time    06/03/13  9:50 AM      Result Value Range   Color, Urine YELLOW  YELLOW   APPearance CLEAR  CLEAR   Specific Gravity, Urine >1.030 (*) 1.005 - 1.030   pH 6.0  5.0 - 8.0   Glucose, UA NEGATIVE  NEGATIVE mg/dL   Hgb urine dipstick NEGATIVE  NEGATIVE   Bilirubin Urine NEGATIVE  NEGATIVE   Ketones, ur NEGATIVE  NEGATIVE mg/dL   Protein, ur NEGATIVE  NEGATIVE mg/dL   Urobilinogen, UA 0.2  0.0 - 1.0 mg/dL   Nitrite NEGATIVE  NEGATIVE   Leukocytes, UA NEGATIVE  NEGATIVE  POCT PREGNANCY, URINE     Status: None    Collection Time    06/03/13 10:08 AM      Result Value Range   Preg Test, Ur NEGATIVE  NEGATIVE  WET PREP, GENITAL     Status: Abnormal   Collection Time    06/03/13 10:40 AM      Result Value Range   Yeast Wet Prep HPF POC NONE SEEN  NONE SEEN   Trich, Wet Prep NONE SEEN  NONE SEEN   Clue Cells Wet Prep HPF POC FEW (*) NONE SEEN   WBC, Wet Prep HPF POC MODERATE (*) NONE SEEN    MAU Course  Procedures   Assessment and Plan  Bacterial Vaginosis Vaginitis  Plan: RX Diflucan 150 mg PO Flagyl 500 mg BID x 7 days #14 GC/CT pending  Chrissie Noa 06/03/2013, 10:44 AM

## 2013-06-03 NOTE — MAU Note (Signed)
Vaginal itching and irritation, past 1-2wks, past wk started having burning with urination.

## 2013-06-04 LAB — GC/CHLAMYDIA PROBE AMP
CT Probe RNA: NEGATIVE
GC Probe RNA: NEGATIVE

## 2013-09-20 ENCOUNTER — Encounter (HOSPITAL_COMMUNITY): Payer: Self-pay | Admitting: Emergency Medicine

## 2013-09-20 ENCOUNTER — Emergency Department (INDEPENDENT_AMBULATORY_CARE_PROVIDER_SITE_OTHER)
Admission: EM | Admit: 2013-09-20 | Discharge: 2013-09-20 | Disposition: A | Discharge status: Home / Self Care | Source: Home / Self Care | Attending: Family Medicine | Admitting: Family Medicine

## 2013-09-20 DIAGNOSIS — J069 Acute upper respiratory infection, unspecified: Secondary | ICD-10-CM

## 2013-09-20 LAB — POCT RAPID STREP A: Streptococcus, Group A Screen (Direct): NEGATIVE

## 2013-09-20 MED ORDER — IPRATROPIUM BROMIDE 0.06 % NA SOLN: 2.0000 | Freq: Four times a day (QID) | NASAL | Status: DC

## 2013-09-20 NOTE — ED Notes (Signed)
C/o pink eye sx since Sunday.   Patient states bilateral eyes has crust around them States she has a lot of drainage mostly from right eye.  States eyes are irritated  C/o sore throat since over the weekend.  States she took cold and flu sx medication.

## 2013-09-20 NOTE — ED Provider Notes (Signed)
CSN: [id number]     Arrival date & time 09/20/13  0804 History   First MD Initiated Contact with Patient 09/20/13 0820     Chief Complaint  Patient presents with  . Sore Throat  . Conjunctivitis   (Consider location/radiation/quality/duration/timing/severity/associated sxs/prior Treatment) Patient is a 22 y.o. female presenting with pharyngitis and conjunctivitis. The history is provided by the patient.  Sore Throat This is a new problem. The current episode started more than 1 week ago. The problem has been gradually improving. The symptoms are aggravated by swallowing.  Conjunctivitis    Past Medical History  Diagnosis Date  . Bacterial vaginal infection   . Ovarian cyst   . Chlamydia     2013  . Abnormal Pap smear    Past Surgical History  Procedure Laterality Date  . Arm surgery x3    . Wisdom tooth extraction    . Therapeutic abortion     Family History  Problem Relation Age of Onset  . Hypertension Mother   . Cancer Paternal Grandmother     colon  . Diabetes Neg Hx   . Heart disease Neg Hx   . Hearing loss Neg Hx   . Stroke Neg Hx    History  Substance Use Topics  . Smoking status: Never Smoker   . Smokeless tobacco: Never Used  . Alcohol Use: No   OB History   Grav Para Term Preterm Abortions TAB SAB Ect Mult Living   2    2 2     0     Review of Systems  Constitutional: Negative.   HENT: Positive for congestion, postnasal drip, rhinorrhea and sore throat.   Eyes: Positive for redness.  Respiratory: Positive for cough.   Musculoskeletal: Negative.   Skin: Negative.     Allergies  Review of patient's allergies indicates no known allergies.  Home Medications   Current Outpatient Rx  Name  Route  Sig  Dispense  Refill  . fluconazole (DIFLUCAN) 150 MG tablet   Oral   Take 1 tablet (150 mg total) by mouth once.   1 tablet   1   . ibuprofen (ADVIL,MOTRIN) 800 MG tablet   Oral   Take 1 tablet (800 mg total) by mouth 3 (three) times daily.  60 tablet   0   . ipratropium (ATROVENT) 0.06 % nasal spray   Nasal   Place 2 sprays into the nose 4 (four) times daily.   15 mL   1   . metroNIDAZOLE (FLAGYL) 500 MG tablet   Oral   Take 1 tablet (500 mg total) by mouth 2 (two) times daily.   14 tablet   0   . norethindrone-ethinyl estradiol (TRIPHASIL,CYCLAFEM,ALYACEN) 0.5/0.75/1-35 MG-MCG tablet   Oral   Take 1 tablet by mouth daily.          BP 126/82  Pulse 78  Temp(Src) 97.6 F (36.4 C) (Oral)  Resp 18  SpO2 99%  LMP 08/28/2013 Physical Exam  Nursing note and vitals reviewed. Constitutional: She is oriented to person, place, and time. She appears well-developed and well-nourished.  HENT:  Right Ear: External ear normal.  Left Ear: External ear normal.  Nose: Nose normal.  Mouth/Throat: Oropharynx is clear and moist.  Eyes: EOM and lids are normal. Pupils are equal, round, and reactive to light. Right conjunctiva is not injected. Right conjunctiva has no hemorrhage. Left conjunctiva is injected. Left conjunctiva has no hemorrhage.  Neck: Normal range of motion. Neck supple.  Cardiovascular: Normal  rate.   Pulmonary/Chest: Effort normal and breath sounds normal.  Lymphadenopathy:    She has no cervical adenopathy.  Neurological: She is alert and oriented to person, place, and time.  Skin: Skin is warm and dry.    ED Course  Procedures (including critical care time) Labs Review Labs Reviewed  POCT RAPID STREP A ([facility] CARE ONLY)   Imaging Review No results found.    MDM  Strep neg.    Linna Hoff, MD 09/20/13 [phone]

## 2013-09-22 LAB — CULTURE, GROUP A STREP

## 2013-10-29 ENCOUNTER — Telehealth: Payer: Self-pay

## 2013-10-29 NOTE — Telephone Encounter (Signed)
Called patient, no answer- left message stating we are trying to return your phone call, please call us back at the clinics 

## 2013-10-29 NOTE — Telephone Encounter (Signed)
Pt called and stated that she needed a refill on BCP's. Re:  Pt has had her annual on 11-2012

## 2013-10-30 NOTE — Telephone Encounter (Signed)
Called patient, no answer- left message stating we are trying to return your phone call, if you still need assistance please call us back at the clinics 

## 2013-11-01 ENCOUNTER — Ambulatory Visit (INDEPENDENT_AMBULATORY_CARE_PROVIDER_SITE_OTHER): Admitting: Nurse Practitioner

## 2013-11-01 ENCOUNTER — Encounter: Payer: Self-pay | Admitting: Nurse Practitioner

## 2013-11-01 VITALS — BP 127/79 | HR 77 | Ht 71.0 in | Wt 197.5 lb

## 2013-11-01 DIAGNOSIS — Z309 Encounter for contraceptive management, unspecified: Secondary | ICD-10-CM

## 2013-11-01 DIAGNOSIS — N760 Acute vaginitis: Secondary | ICD-10-CM

## 2013-11-01 MED ORDER — NORETHIN-ETH ESTRAD TRIPHASIC 0.5/0.75/1-35 MG-MCG PO TABS: 1.0000 | ORAL_TABLET | Freq: Every day | ORAL | Status: DC

## 2013-11-01 NOTE — Progress Notes (Signed)
History:  Brooke Donovan is a 22 y.o. G2P0020 who presents to [facility] clinic today for STD screening and change in birth control. She would like to go back to birth control pills. She has been on them in the past. Denies any migraine, HTN, blood clots.   The following portions of the patient's history were reviewed and updated as appropriate: allergies, current medications, past family history, past medical history, past social history, past surgical history and problem list.  Review of Systems:  Pertinent items are noted in HPI.  Objective:  Physical Exam BP 127/79  Pulse 77  Ht 5\' 11"  (1.803 m)  Wt 197 lb 8 oz (89.585 kg)  BMI 27.56 kg/m2  LMP 10/22/2013 GENERAL: Well-developed, well-nourished female in no acute distress.  HEENT: Normocephalic, atraumatic.  NECK: Supple. Normal thyroid.  LUNGS: Normal rate. Clear to auscultation bilaterally.  HEART: Regular rate and rhythm with no adventitious sounds.  EXTREMITIES: No cyanosis, clubbing, or edema, 2+ distal pulses.   Labs and Imaging No results found. No results found for this or any previous visit (from the past 24 hour(s)).  Assessment & Plan:  Assessment:  Vaginitis - Plan: GC/Chlamydia Probe Amp, Wet prep, genital Contraception   Plans:  Refill BCPs for one year RTC for annual exam   Delbert Phenix, NP 11/01/2013 1:49 PM

## 2013-11-01 NOTE — Patient Instructions (Signed)
Contraception Choices °Birth control (contraception) is the use of any methods or devices to stop pregnancy from happening. Below are some methods to help avoid pregnancy. °HORMONAL BIRTH CONTROL °· A small tube put under the skin of the upper arm (implant). The tube can stay in place for 3 years. The implant must be taken out after 3 years. °· Shots given every 3 months. °· Pills taken every day. °· Patches that are changed once a week. °· A ring put into the vagina (vaginal ring). The ring is left in place for 3 weeks and removed for 1 week. Then, a new ring is put in the vagina. °· Emergency birth control pills taken after unprotected sex (intercourse). °BARRIER BIRTH CONTROL  °· A thin covering worn on the penis (female condom) during sex. °· A soft, loose covering put into the vagina (female condom) before sex. °· A rubber bowl that sits over the cervix (diaphragm). The bowl must be made for you. The bowl is put into the vagina before sex. The bowl is left in place for 6 to 8 hours after sex. °· A small, soft cup that fits over the cervix (cervical cap). The cup must be made for you. The cup can be left in place for 48 hours after sex. °· A sponge that is put into the vagina before sex. °· A chemical that kills or stops sperm from getting into the cervix and uterus (spermicide). The chemical may be a cream, jelly, foam, or pill. °INTRAUTERINE (IUD) BIRTH CONTROL  °· IUD birth control is a small, T-shaped piece of plastic. The plastic is put inside the uterus. There are 2 types of IUD: °· Copper IUD. The IUD is covered in copper wire. The copper makes a fluid that kills sperm. It can stay in place for 10 years. °· Hormone IUD. The hormone stops pregnancy from happening. It can stay in place for 5 years. °PERMANENT METHODS °· When the woman has her fallopian tubes sealed, tied, or blocked during surgery. This stops the egg from traveling to the uterus. °· The doctor places a small coil or insert into each fallopian  tube. This causes scar tissue to form and blocks the fallopian tubes. °· When the female has the tubes that carry sperm tied off (vasectomy). °NATURAL FAMILY PLANNING BIRTH CONTROL  °· Natural family planning means not having sex or using barrier birth control on the days the woman could become pregnant. °· Use a calendar to keep track of the length of each period and know the days she can get pregnant. °· Avoid sex during ovulation. °· Use a thermometer to measure body temperature. Also watch for symptoms of ovulation. °· Time sex to be after the woman has ovulated. °Use condoms to help protect yourself against sexually transmitted infections (STIs). Do this no matter what type of birth control you use. Talk to your doctor about which type of birth control is best for you. °Document Released: 09/04/2009 Document Revised: 07/10/2013 Document Reviewed: 05/29/2013 °[employer]® Patient Information ©2014 [employer]. ° °

## 2013-11-01 NOTE — Progress Notes (Signed)
Would like to get birth control pills, was previously on the depo shot. Also would like std testing. Not due for pap.

## 2013-11-02 LAB — WET PREP, GENITAL
Trich, Wet Prep: NONE SEEN
WBC, Wet Prep HPF POC: NONE SEEN
Yeast Wet Prep HPF POC: NONE SEEN

## 2013-11-02 LAB — GC/CHLAMYDIA PROBE AMP
CT Probe RNA: NEGATIVE
GC Probe RNA: NEGATIVE

## 2014-04-28 ENCOUNTER — Telehealth: Payer: Self-pay | Admitting: *Deleted

## 2014-04-28 NOTE — Telephone Encounter (Addendum)
Called [facility], left a message we are returning your call- please call clinic.per chart review last gynecology annual 11/2012. Had visit 10/2013 for issues

## 2014-04-28 NOTE — Telephone Encounter (Signed)
Brooke Donovan left a message asking for proof she had a physical earlier this years

## 2014-04-29 NOTE — Telephone Encounter (Signed)
Brooke Donovan left a message she is returning our call. Called her and unable to leave a message- heard a busy signal.

## 2014-04-30 NOTE — Telephone Encounter (Signed)
Called Brooke Donovan and left message we are calling back - we can help you with your request- please call back and talk with front office registar

## 2014-05-07 ENCOUNTER — Encounter (HOSPITAL_COMMUNITY): Payer: Self-pay | Admitting: *Deleted

## 2014-05-07 ENCOUNTER — Inpatient Hospital Stay (HOSPITAL_COMMUNITY)
Admission: AD | Admit: 2014-05-07 | Discharge: 2014-05-07 | Disposition: A | Discharge status: Home / Self Care | Source: Ambulatory Visit | Attending: Obstetrics and Gynecology | Admitting: Obstetrics and Gynecology

## 2014-05-07 DIAGNOSIS — R109 Unspecified abdominal pain: Secondary | ICD-10-CM | POA: Insufficient documentation

## 2014-05-07 DIAGNOSIS — N926 Irregular menstruation, unspecified: Secondary | ICD-10-CM

## 2014-05-07 LAB — CBC
HCT: 36.5 % (ref 36.0–46.0)
Hemoglobin: 12 g/dL (ref 12.0–15.0)
MCH: 28.7 pg (ref 26.0–34.0)
MCHC: 32.9 g/dL (ref 30.0–36.0)
MCV: 87.3 fL (ref 78.0–100.0)
Platelets: 205 10*3/uL (ref 150–400)
RBC: 4.18 MIL/uL (ref 3.87–5.11)
RDW: 13 % (ref 11.5–15.5)
WBC: 7.1 10*3/uL (ref 4.0–10.5)

## 2014-05-07 LAB — URINALYSIS, ROUTINE W REFLEX MICROSCOPIC
Bilirubin Urine: NEGATIVE
Glucose, UA: NEGATIVE mg/dL
Ketones, ur: NEGATIVE mg/dL
Leukocytes, UA: NEGATIVE
Nitrite: NEGATIVE
Protein, ur: NEGATIVE mg/dL
Specific Gravity, Urine: 1.01 (ref 1.005–1.030)
Urobilinogen, UA: 0.2 mg/dL (ref 0.0–1.0)
pH: 7 (ref 5.0–8.0)

## 2014-05-07 LAB — URINE MICROSCOPIC-ADD ON

## 2014-05-07 LAB — POCT PREGNANCY, URINE: Preg Test, Ur: NEGATIVE

## 2014-05-07 NOTE — MAU Provider Note (Signed)
Chief Complaint: Vaginal Bleeding   First Provider Initiated Contact with Patient 05/07/14 2337     SUBJECTIVE HPI: Brooke Donovan is a 23 y.o. [id number] female who presents with vaginal bleeding and mild cramping x4 days. Bleeding has been as heavy as a normal period, light now. Should not be starting period until next week. Normally has 28 day cycles. Is on OCPs and taking them correctly without missing any doses. Last intercourse 5 days ago. No bleeding that day. Denies recent stressful events, weight change were major changes in her life.  Had pelvic exam at [facility] department last week. Diagnosed with BV. Has almost completed course of Flagyl. GC/CT were negative. Declines repeat of both. She states she is in a mutually monogamous relationship.  Past Medical History  Diagnosis Date  . Bacterial vaginal infection   . Ovarian cyst   . Chlamydia     2013  . Abnormal Pap smear    OB History  Gravida Para Term Preterm AB SAB TAB Ectopic Multiple Living  2    2  2    0    # Outcome Date GA Lbr Len/2nd Weight Sex Delivery Anes PTL Lv  2 TAB           1 TAB              Past Surgical History  Procedure Laterality Date  . Arm surgery x3    . Wisdom tooth extraction    . Therapeutic abortion     History   Social History  . Marital Status: Single    Spouse Name: N/A    Number of Children: N/A  . Years of Education: N/A   Occupational History  . Not on file.   Social History Main Topics  . Smoking status: Never Smoker   . Smokeless tobacco: Never Used  . Alcohol Use: No  . Drug Use: No  . Sexual Activity: Yes    Birth Control/ Protection: Pill   Other Topics Concern  . Not on file   Social History Narrative  . No narrative on file   No current facility-administered medications on file prior to encounter.   Current Outpatient Prescriptions on File Prior to Encounter  Medication Sig Dispense Refill  . norethindrone-ethinyl estradiol (TRIPHASIL,CYCLAFEM,ALYACEN)  0.5/0.75/1-35 MG-MCG tablet Take 1 tablet by mouth daily.  1 Package  11  . [DISCONTINUED] Norethindrone-Eth Estradiol (BALZIVA PO) Take by mouth.       No Known Allergies  ROS: Pertinent positive items in HPI. Negative for fever, chills, vaginal discharge, urinary complaints, pain with intercourse, GI complaints.  OBJECTIVE Blood pressure 136/86, pulse 65, temperature 99.3 F (37.4 C), temperature source Oral, resp. rate 18, height 5' 10.5" (1.791 m), weight 89.449 kg (197 lb 3.2 oz), last menstrual period 05/04/2014, SpO2 100.00%. GENERAL: Well-developed, well-nourished female in no acute distress.  HEENT: Normocephalic HEART: normal rate RESP: normal effort ABDOMEN: Soft, non-tender. No CVA tenderness. EXTREMITIES: Nontender, no edema NEURO: Alert and oriented SPECULUM EXAM: NEFG, small amount of dark red blood noted, cervix clean. No polyps. BIMANUAL: cervix close; uterus normal size, no adnexal tenderness or masses. No cervical motion tenderness.  LAB RESULTS Results for orders placed during the hospital encounter of 05/07/14 (from the past 24 hour(s))  URINALYSIS, ROUTINE W REFLEX MICROSCOPIC     Status: Abnormal   Collection Time    05/07/14  8:31 PM      Result Value Ref Range   Color, Urine YELLOW  YELLOW   APPearance  CLEAR  CLEAR   Specific Gravity, Urine 1.010  1.005 - 1.030   pH 7.0  5.0 - 8.0   Glucose, UA NEGATIVE  NEGATIVE mg/dL   Hgb urine dipstick LARGE (*) NEGATIVE   Bilirubin Urine NEGATIVE  NEGATIVE   Ketones, ur NEGATIVE  NEGATIVE mg/dL   Protein, ur NEGATIVE  NEGATIVE mg/dL   Urobilinogen, UA 0.2  0.0 - 1.0 mg/dL   Nitrite NEGATIVE  NEGATIVE   Leukocytes, UA NEGATIVE  NEGATIVE  URINE MICROSCOPIC-ADD ON     Status: None   Collection Time    05/07/14  8:31 PM      Result Value Ref Range   Squamous Epithelial / LPF RARE  RARE   WBC, UA 0-2  <3 WBC/hpf   RBC / HPF 3-6  <3 RBC/hpf   Bacteria, UA RARE  RARE  POCT PREGNANCY, URINE     Status: None    Collection Time    05/07/14  8:33 PM      Result Value Ref Range   Preg Test, Ur NEGATIVE  NEGATIVE  CBC     Status: None   Collection Time    05/07/14  8:59 PM      Result Value Ref Range   WBC 7.1  4.0 - 10.5 K/uL   RBC 4.18  3.87 - 5.11 MIL/uL   Hemoglobin 12.0  12.0 - 15.0 g/dL   HCT 16.136.5  09.636.0 - 04.546.0 %   MCV 87.3  78.0 - 100.0 fL   MCH 28.7  26.0 - 34.0 pg   MCHC 32.9  30.0 - 36.0 g/dL   RDW 40.913.0  81.111.5 - 91.415.5 %   Platelets 205  150 - 400 K/uL    IMAGING No results found.  MAU COURSE  ASSESSMENT 1. Irregular menstrual bleeding    PLAN Discharge home in stable condition.  Follow-up Information   [facility] (As needed if symptoms worsen)    Specialty:  Obstetrics and Gynecology   Contact information:   [address] [place] [address] [phone]      Follow [facility] with [facility]. (As needed in emergencies)    Contact information:   [id number] [place] [id number] [phone]        Medication List         FLAGYL PO  Take 1 tablet by mouth 2 (two) times daily.     MULTIVITAMIN PO  Take 1 tablet by mouth daily.     norethindrone-ethinyl estradiol 0.5/0.75/1-35 MG-MCG tablet  Commonly known as:  CYCLAFEM,ALYACEN  Take 1 tablet by mouth daily.       [place] Juston Goheen, CNM 05/07/2014  8:50 PM

## 2014-05-07 NOTE — Discharge Instructions (Signed)
Abnormal Uterine Bleeding Abnormal uterine bleeding can affect women at various stages in life, including teenagers, women in their reproductive years, pregnant women, and women who have reached menopause. Several kinds of uterine bleeding are considered abnormal, including:  Bleeding or spotting between periods.   Bleeding after sexual intercourse.   Bleeding that is heavier or more than normal.   Periods that last longer than usual.  Bleeding after menopause.  Many cases of abnormal uterine bleeding are minor and simple to treat, while others are more serious. Any type of abnormal bleeding should be evaluated by your health care provider. Treatment will depend on the cause of the bleeding. HOME CARE INSTRUCTIONS Monitor your condition for any changes. The following actions may help to alleviate any discomfort you are experiencing:  Avoid the use of tampons and douches as directed by your health care provider.  Change your pads frequently. You should get regular pelvic exams and Pap tests. Keep all follow-up appointments for diagnostic tests as directed by your health care provider.  SEEK MEDICAL CARE IF:   Your bleeding lasts more than 1 week.   You feel dizzy at times.  SEEK IMMEDIATE MEDICAL CARE IF:   You pass out.   You are changing pads every 15 to 30 minutes.   You have abdominal pain.  You have a fever.   You become sweaty or weak.   You are passing large blood clots from the vagina.   You start to feel nauseous and vomit. MAKE SURE YOU:   Understand these instructions.  Will watch your condition.  Will get help right away if you are not doing well or get worse. Document Released: 11/07/2005 Document Revised: 11/12/2013 Document Reviewed: 06/06/2013 [employer] Patient Information 2015 [employer]. This information is not intended to replace advice given to you by your health care provider. Make sure you discuss any questions you have with your  health care provider.  

## 2014-05-07 NOTE — MAU Note (Signed)
Sunday started to bleed and had cramping at that time. Cramping stopped Monday. Pt. Is using thick panty liners 2x per day. Denies bleeding like this in the past. Denies nausea or vomiting.

## 2014-05-07 NOTE — MAU Note (Signed)
Pt c/o vaginal bleeding that started Sunday. Period not suppose to start until next week. Has abdominal cramping as well. States she is on birth control pill. States that she takes it every day. Denies fever, chills, urinary symptoms.

## 2014-05-12 NOTE — MAU Provider Note (Signed)
Attestation of Attending Supervision of Advanced Practitioner: Evaluation and management procedures were performed by the PA/NP/CNM/OB Fellow under my supervision/collaboration. Chart reviewed and agree with management and plan.  Brooke Donovan V 05/12/2014 3:12 AM

## 2014-09-22 ENCOUNTER — Encounter (HOSPITAL_COMMUNITY): Payer: Self-pay | Admitting: *Deleted

## 2014-10-06 ENCOUNTER — Other Ambulatory Visit: Payer: Self-pay

## 2014-10-06 NOTE — Telephone Encounter (Signed)
Patient called requesting refill of OCPs. Last refilled 11/01/13 by Jannifer RodneyLinda Barefoot.

## 2014-10-06 NOTE — Telephone Encounter (Signed)
Contacted patient, informed of refill of birth control pills confirmed by her pharmacy. Encouraged patient to call in am to make an appointment for yearly exam and renewal of prescriptions for birth control. Pt verbalizes understanding.

## 2014-12-05 ENCOUNTER — Ambulatory Visit (INDEPENDENT_AMBULATORY_CARE_PROVIDER_SITE_OTHER): Admitting: Obstetrics and Gynecology

## 2014-12-05 ENCOUNTER — Encounter: Payer: Self-pay | Admitting: Obstetrics and Gynecology

## 2014-12-05 VITALS — BP 120/72 | HR 72 | Temp 98.4°F

## 2014-12-05 DIAGNOSIS — Z1151 Encounter for screening for human papillomavirus (HPV): Secondary | ICD-10-CM

## 2014-12-05 DIAGNOSIS — Z01419 Encounter for gynecological examination (general) (routine) without abnormal findings: Secondary | ICD-10-CM

## 2014-12-05 NOTE — Patient Instructions (Signed)
Preventive Care for Adults A healthy lifestyle and preventive care can promote health and wellness. Preventive health guidelines for women include the following key practices.  A routine yearly physical is a good way to check with your health care provider about your health and preventive screening. It is a chance to share any concerns and updates on your health and to receive a thorough exam.  Visit your dentist for a routine exam and preventive care every 6 months. Brush your teeth twice a day and floss once a day. Good oral hygiene prevents tooth decay and gum disease.  The frequency of eye exams is based on your age, health, family medical history, use of contact lenses, and other factors. Follow your health care provider's recommendations for frequency of eye exams.  Eat a healthy diet. Foods like vegetables, fruits, whole grains, low-fat dairy products, and lean protein foods contain the nutrients you need without too many calories. Decrease your intake of foods high in solid fats, added sugars, and salt. Eat the right amount of calories for you.Get information about a proper diet from your health care provider, if necessary.  Regular physical exercise is one of the most important things you can do for your health. Most adults should get at least 150 minutes of moderate-intensity exercise (any activity that increases your heart rate and causes you to sweat) each week. In addition, most adults need muscle-strengthening exercises on 2 or more days a week.  Maintain a healthy weight. The body mass index (BMI) is a screening tool to identify possible weight problems. It provides an estimate of body fat based on height and weight. Your health care provider can find your BMI and can help you achieve or maintain a healthy weight.For adults 20 years and older:  A BMI below 18.5 is considered underweight.  A BMI of 18.5 to 24.9 is normal.  A BMI of 25 to 29.9 is considered overweight.  A BMI of  30 and above is considered obese.  Maintain normal blood lipids and cholesterol levels by exercising and minimizing your intake of saturated fat. Eat a balanced diet with plenty of fruit and vegetables. Blood tests for lipids and cholesterol should begin at age 20 and be repeated every 5 years. If your lipid or cholesterol levels are high, you are over 50, or you are at high risk for heart disease, you may need your cholesterol levels checked more frequently.Ongoing high lipid and cholesterol levels should be treated with medicines if diet and exercise are not working.  If you smoke, find out from your health care provider how to quit. If you do not use tobacco, do not start.  Lung cancer screening is recommended for adults aged 55-80 years who are at high risk for developing lung cancer because of a history of smoking. A yearly low-dose CT scan of the lungs is recommended for people who have at least a 30-pack-year history of smoking and are a current smoker or have quit within the past 15 years. A pack year of smoking is smoking an average of 1 pack of cigarettes a day for 1 year (for example: 1 pack a day for 30 years or 2 packs a day for 15 years). Yearly screening should continue until the smoker has stopped smoking for at least 15 years. Yearly screening should be stopped for people who develop a health problem that would prevent them from having lung cancer treatment.  If you are pregnant, do not drink alcohol. If you are breastfeeding,   be very cautious about drinking alcohol. If you are not pregnant and choose to drink alcohol, do not have more than 1 drink per day. One drink is considered to be 12 ounces (355 mL) of beer, 5 ounces (148 mL) of wine, or 1.5 ounces (44 mL) of liquor.  Avoid use of street drugs. Do not share needles with anyone. Ask for help if you need support or instructions about stopping the use of drugs.  High blood pressure causes heart disease and increases the risk of  stroke. Your blood pressure should be checked at least every 1 to 2 years. Ongoing high blood pressure should be treated with medicines if weight loss and exercise do not work.  If you are 75-52 years old, ask your health care provider if you should take aspirin to prevent strokes.  Diabetes screening involves taking a blood sample to check your fasting blood sugar level. This should be done once every 3 years, after age 15, if you are within normal weight and without risk factors for diabetes. Testing should be considered at a younger age or be carried out more frequently if you are overweight and have at least 1 risk factor for diabetes.  Breast cancer screening is essential preventive care for women. You should practice "breast self-awareness." This means understanding the normal appearance and feel of your breasts and may include breast self-examination. Any changes detected, no matter how small, should be reported to a health care provider. Women in their 58s and 30s should have a clinical breast exam (CBE) by a health care provider as part of a regular health exam every 1 to 3 years. After age 16, women should have a CBE every year. Starting at age 53, women should consider having a mammogram (breast X-ray test) every year. Women who have a family history of breast cancer should talk to their health care provider about genetic screening. Women at a high risk of breast cancer should talk to their health care providers about having an MRI and a mammogram every year.  Breast cancer gene (BRCA)-related cancer risk assessment is recommended for women who have family members with BRCA-related cancers. BRCA-related cancers include breast, ovarian, tubal, and peritoneal cancers. Having family members with these cancers may be associated with an increased risk for harmful changes (mutations) in the breast cancer genes BRCA1 and BRCA2. Results of the assessment will determine the need for genetic counseling and  BRCA1 and BRCA2 testing.  Routine pelvic exams to screen for cancer are no longer recommended for nonpregnant women who are considered low risk for cancer of the pelvic organs (ovaries, uterus, and vagina) and who do not have symptoms. Ask your health care provider if a screening pelvic exam is right for you.  If you have had past treatment for cervical cancer or a condition that could lead to cancer, you need Pap tests and screening for cancer for at least 20 years after your treatment. If Pap tests have been discontinued, your risk factors (such as having a new sexual partner) need to be reassessed to determine if screening should be resumed. Some women have medical problems that increase the chance of getting cervical cancer. In these cases, your health care provider may recommend more frequent screening and Pap tests.  The HPV test is an additional test that may be used for cervical cancer screening. The HPV test looks for the virus that can cause the cell changes on the cervix. The cells collected during the Pap test can be  tested for HPV. The HPV test could be used to screen women aged 30 years and older, and should be used in women of any age who have unclear Pap test results. After the age of 30, women should have HPV testing at the same frequency as a Pap test.  Colorectal cancer can be detected and often prevented. Most routine colorectal cancer screening begins at the age of 50 years and continues through age 75 years. However, your health care provider may recommend screening at an earlier age if you have risk factors for colon cancer. On a yearly basis, your health care provider may provide home test kits to check for hidden blood in the stool. Use of a small camera at the end of a tube, to directly examine the colon (sigmoidoscopy or colonoscopy), can detect the earliest forms of colorectal cancer. Talk to your health care provider about this at age 50, when routine screening begins. Direct  exam of the colon should be repeated every 5-10 years through age 75 years, unless early forms of pre-cancerous polyps or small growths are found.  People who are at an increased risk for hepatitis B should be screened for this virus. You are considered at high risk for hepatitis B if:  You were born in a country where hepatitis B occurs often. Talk with your health care provider about which countries are considered high risk.  Your parents were born in a high-risk country and you have not received a shot to protect against hepatitis B (hepatitis B vaccine).  You have HIV or AIDS.  You use needles to inject street drugs.  You live with, or have sex with, someone who has hepatitis B.  You get hemodialysis treatment.  You take certain medicines for conditions like cancer, organ transplantation, and autoimmune conditions.  Hepatitis C blood testing is recommended for all people born from 1945 through 1965 and any individual with known risks for hepatitis C.  Practice safe sex. Use condoms and avoid high-risk sexual practices to reduce the spread of sexually transmitted infections (STIs). STIs include gonorrhea, chlamydia, syphilis, trichomonas, herpes, HPV, and human immunodeficiency virus (HIV). Herpes, HIV, and HPV are viral illnesses that have no cure. They can result in disability, cancer, and death.  You should be screened for sexually transmitted illnesses (STIs) including gonorrhea and chlamydia if:  You are sexually active and are younger than 24 years.  You are older than 24 years and your health care provider tells you that you are at risk for this type of infection.  Your sexual activity has changed since you were last screened and you are at an increased risk for chlamydia or gonorrhea. Ask your health care provider if you are at risk.  If you are at risk of being infected with HIV, it is recommended that you take a prescription medicine daily to prevent HIV infection. This is  called preexposure prophylaxis (PrEP). You are considered at risk if:  You are a heterosexual woman, are sexually active, and are at increased risk for HIV infection.  You take drugs by injection.  You are sexually active with a partner who has HIV.  Talk with your health care provider about whether you are at high risk of being infected with HIV. If you choose to begin PrEP, you should first be tested for HIV. You should then be tested every 3 months for as long as you are taking PrEP.  Osteoporosis is a disease in which the bones lose minerals and strength   with aging. This can result in serious bone fractures or breaks. The risk of osteoporosis can be identified using a bone density scan. Women ages 65 years and over and women at risk for fractures or osteoporosis should discuss screening with their health care providers. Ask your health care provider whether you should take a calcium supplement or vitamin D to reduce the rate of osteoporosis.  Menopause can be associated with physical symptoms and risks. Hormone replacement therapy is available to decrease symptoms and risks. You should talk to your health care provider about whether hormone replacement therapy is right for you.  Use sunscreen. Apply sunscreen liberally and repeatedly throughout the day. You should seek shade when your shadow is shorter than you. Protect yourself by wearing long sleeves, pants, a wide-brimmed hat, and sunglasses year round, whenever you are outdoors.  Once a month, do a whole body skin exam, using a mirror to look at the skin on your back. Tell your health care provider of new moles, moles that have irregular borders, moles that are larger than a pencil eraser, or moles that have changed in shape or color.  Stay current with required vaccines (immunizations).  Influenza vaccine. All adults should be immunized every year.  Tetanus, diphtheria, and acellular pertussis (Td, Tdap) vaccine. Pregnant women should  receive 1 dose of Tdap vaccine during each pregnancy. The dose should be obtained regardless of the length of time since the last dose. Immunization is preferred during the 27th-36th week of gestation. An adult who has not previously received Tdap or who does not know her vaccine status should receive 1 dose of Tdap. This initial dose should be followed by tetanus and diphtheria toxoids (Td) booster doses every 10 years. Adults with an unknown or incomplete history of completing a 3-dose immunization series with Td-containing vaccines should begin or complete a primary immunization series including a Tdap dose. Adults should receive a Td booster every 10 years.  Varicella vaccine. An adult without evidence of immunity to varicella should receive 2 doses or a second dose if she has previously received 1 dose. Pregnant females who do not have evidence of immunity should receive the first dose after pregnancy. This first dose should be obtained before leaving the health care facility. The second dose should be obtained 4-8 weeks after the first dose.  Human papillomavirus (HPV) vaccine. Females aged 13-26 years who have not received the vaccine previously should obtain the 3-dose series. The vaccine is not recommended for use in pregnant females. However, pregnancy testing is not needed before receiving a dose. If a female is found to be pregnant after receiving a dose, no treatment is needed. In that case, the remaining doses should be delayed until after the pregnancy. Immunization is recommended for any person with an immunocompromised condition through the age of 26 years if she did not get any or all doses earlier. During the 3-dose series, the second dose should be obtained 4-8 weeks after the first dose. The third dose should be obtained 24 weeks after the first dose and 16 weeks after the second dose.  Zoster vaccine. One dose is recommended for adults aged 60 years or older unless certain conditions are  present.  Measles, mumps, and rubella (MMR) vaccine. Adults born before 1957 generally are considered immune to measles and mumps. Adults born in 1957 or later should have 1 or more doses of MMR vaccine unless there is a contraindication to the vaccine or there is laboratory evidence of immunity to   each of the three diseases. A routine second dose of MMR vaccine should be obtained at least 28 days after the first dose for students attending postsecondary schools, health care workers, or international travelers. People who received inactivated measles vaccine or an unknown type of measles vaccine during 1963-1967 should receive 2 doses of MMR vaccine. People who received inactivated mumps vaccine or an unknown type of mumps vaccine before 1979 and are at high risk for mumps infection should consider immunization with 2 doses of MMR vaccine. For females of childbearing age, rubella immunity should be determined. If there is no evidence of immunity, females who are not pregnant should be vaccinated. If there is no evidence of immunity, females who are pregnant should delay immunization until after pregnancy. Unvaccinated health care workers born before 1957 who lack laboratory evidence of measles, mumps, or rubella immunity or laboratory confirmation of disease should consider measles and mumps immunization with 2 doses of MMR vaccine or rubella immunization with 1 dose of MMR vaccine.  Pneumococcal 13-valent conjugate (PCV13) vaccine. When indicated, a person who is uncertain of her immunization history and has no record of immunization should receive the PCV13 vaccine. An adult aged 19 years or older who has certain medical conditions and has not been previously immunized should receive 1 dose of PCV13 vaccine. This PCV13 should be followed with a dose of pneumococcal polysaccharide (PPSV23) vaccine. The PPSV23 vaccine dose should be obtained at least 8 weeks after the dose of PCV13 vaccine. An adult aged 19  years or older who has certain medical conditions and previously received 1 or more doses of PPSV23 vaccine should receive 1 dose of PCV13. The PCV13 vaccine dose should be obtained 1 or more years after the last PPSV23 vaccine dose.  Pneumococcal polysaccharide (PPSV23) vaccine. When PCV13 is also indicated, PCV13 should be obtained first. All adults aged 65 years and older should be immunized. An adult younger than age 65 years who has certain medical conditions should be immunized. Any person who resides in a nursing home or long-term care facility should be immunized. An adult smoker should be immunized. People with an immunocompromised condition and certain other conditions should receive both PCV13 and PPSV23 vaccines. People with human immunodeficiency virus (HIV) infection should be immunized as soon as possible after diagnosis. Immunization during chemotherapy or radiation therapy should be avoided. Routine use of PPSV23 vaccine is not recommended for American Indians, [place] Natives, or people younger than 65 years unless there are medical conditions that require PPSV23 vaccine. When indicated, people who have unknown immunization and have no record of immunization should receive PPSV23 vaccine. One-time revaccination 5 years after the first dose of PPSV23 is recommended for people aged 19-64 years who have chronic kidney failure, nephrotic syndrome, asplenia, or immunocompromised conditions. People who received 1-2 doses of PPSV23 before age 65 years should receive another dose of PPSV23 vaccine at age 65 years or later if at least 5 years have passed since the previous dose. Doses of PPSV23 are not needed for people immunized with PPSV23 at or after age 65 years.  Meningococcal vaccine. Adults with asplenia or persistent complement component deficiencies should receive 2 doses of quadrivalent meningococcal conjugate (MenACWY-D) vaccine. The doses should be obtained at least 2 months apart.  Microbiologists working with certain meningococcal bacteria, military recruits, people at risk during an outbreak, and people who travel to or live in countries with a high rate of meningitis should be immunized. A first-year college student up through age   21 years who is living in a residence hall should receive a dose if she did not receive a dose on or after her 16th birthday. Adults who have certain high-risk conditions should receive one or more doses of vaccine.  Hepatitis A vaccine. Adults who wish to be protected from this disease, have certain high-risk conditions, work with hepatitis A-infected animals, work in hepatitis A research labs, or travel to or work in countries with a high rate of hepatitis A should be immunized. Adults who were previously unvaccinated and who anticipate close contact with an international adoptee during the first 60 days after arrival in the [place] from a country with a high rate of hepatitis A should be immunized.  Hepatitis B vaccine. Adults who wish to be protected from this disease, have certain high-risk conditions, may be exposed to blood or other infectious body fluids, are household contacts or sex partners of hepatitis B positive people, are clients or workers in certain care facilities, or travel to or work in countries with a high rate of hepatitis B should be immunized.  Haemophilus influenzae type b (Hib) vaccine. A previously unvaccinated person with asplenia or sickle cell disease or having a scheduled splenectomy should receive 1 dose of Hib vaccine. Regardless of previous immunization, a recipient of a hematopoietic stem cell transplant should receive a 3-dose series 6-12 months after her successful transplant. Hib vaccine is not recommended for adults with HIV infection. Preventive Services / Frequency Ages 64 to 68 years  Blood pressure check.** / Every 1 to 2 years.  Lipid and cholesterol check.** / Every 5 years beginning at age  22.  Clinical breast exam.** / Every 3 years for women in their 88s and 53s.  BRCA-related cancer risk assessment.** / For women who have family members with a BRCA-related cancer (breast, ovarian, tubal, or peritoneal cancers).  Pap test.** / Every 2 years from ages 90 through 51. Every 3 years starting at age 21 through age 56 or 3 with a history of 3 consecutive normal Pap tests.  HPV screening.** / Every 3 years from ages 24 through ages 1 to 46 with a history of 3 consecutive normal Pap tests.  Hepatitis C blood test.** / For any individual with known risks for hepatitis C.  Skin self-exam. / Monthly.  Influenza vaccine. / Every year.  Tetanus, diphtheria, and acellular pertussis (Tdap, Td) vaccine.** / Consult your health care provider. Pregnant women should receive 1 dose of Tdap vaccine during each pregnancy. 1 dose of Td every 10 years.  Varicella vaccine.** / Consult your health care provider. Pregnant females who do not have evidence of immunity should receive the first dose after pregnancy.  HPV vaccine. / 3 doses over 6 months, if 72 and younger. The vaccine is not recommended for use in pregnant females. However, pregnancy testing is not needed before receiving a dose.  Measles, mumps, rubella (MMR) vaccine.** / You need at least 1 dose of MMR if you were born in 1957 or later. You may also need a 2nd dose. For females of childbearing age, rubella immunity should be determined. If there is no evidence of immunity, females who are not pregnant should be vaccinated. If there is no evidence of immunity, females who are pregnant should delay immunization until after pregnancy.  Pneumococcal 13-valent conjugate (PCV13) vaccine.** / Consult your health care provider.  Pneumococcal polysaccharide (PPSV23) vaccine.** / 1 to 2 doses if you smoke cigarettes or if you have certain conditions.  Meningococcal vaccine.** /  1 dose if you are age 19 to 21 years and a first-year college  student living in a residence hall, or have one of several medical conditions, you need to get vaccinated against meningococcal disease. You may also need additional booster doses.  Hepatitis A vaccine.** / Consult your health care provider.  Hepatitis B vaccine.** / Consult your health care provider.  Haemophilus influenzae type b (Hib) vaccine.** / Consult your health care provider. Ages 40 to 64 years  Blood pressure check.** / Every 1 to 2 years.  Lipid and cholesterol check.** / Every 5 years beginning at age 20 years.  Lung cancer screening. / Every year if you are aged 55-80 years and have a 30-pack-year history of smoking and currently smoke or have quit within the past 15 years. Yearly screening is stopped once you have quit smoking for at least 15 years or develop a health problem that would prevent you from having lung cancer treatment.  Clinical breast exam.** / Every year after age 40 years.  BRCA-related cancer risk assessment.** / For women who have family members with a BRCA-related cancer (breast, ovarian, tubal, or peritoneal cancers).  Mammogram.** / Every year beginning at age 40 years and continuing for as long as you are in good health. Consult with your health care provider.  Pap test.** / Every 3 years starting at age 30 years through age 65 or 70 years with a history of 3 consecutive normal Pap tests.  HPV screening.** / Every 3 years from ages 30 years through ages 65 to 70 years with a history of 3 consecutive normal Pap tests.  Fecal occult blood test (FOBT) of stool. / Every year beginning at age 50 years and continuing until age 75 years. You may not need to do this test if you get a colonoscopy every 10 years.  Flexible sigmoidoscopy or colonoscopy.** / Every 5 years for a flexible sigmoidoscopy or every 10 years for a colonoscopy beginning at age 50 years and continuing until age 75 years.  Hepatitis C blood test.** / For all people born from 1945 through  1965 and any individual with known risks for hepatitis C.  Skin self-exam. / Monthly.  Influenza vaccine. / Every year.  Tetanus, diphtheria, and acellular pertussis (Tdap/Td) vaccine.** / Consult your health care provider. Pregnant women should receive 1 dose of Tdap vaccine during each pregnancy. 1 dose of Td every 10 years.  Varicella vaccine.** / Consult your health care provider. Pregnant females who do not have evidence of immunity should receive the first dose after pregnancy.  Zoster vaccine.** / 1 dose for adults aged 60 years or older.  Measles, mumps, rubella (MMR) vaccine.** / You need at least 1 dose of MMR if you were born in 1957 or later. You may also need a 2nd dose. For females of childbearing age, rubella immunity should be determined. If there is no evidence of immunity, females who are not pregnant should be vaccinated. If there is no evidence of immunity, females who are pregnant should delay immunization until after pregnancy.  Pneumococcal 13-valent conjugate (PCV13) vaccine.** / Consult your health care provider.  Pneumococcal polysaccharide (PPSV23) vaccine.** / 1 to 2 doses if you smoke cigarettes or if you have certain conditions.  Meningococcal vaccine.** / Consult your health care provider.  Hepatitis A vaccine.** / Consult your health care provider.  Hepatitis B vaccine.** / Consult your health care provider.  Haemophilus influenzae type b (Hib) vaccine.** / Consult your health care provider. Ages 65   years and over  Blood pressure check.** / Every 1 to 2 years.  Lipid and cholesterol check.** / Every 5 years beginning at age 1 years.  Lung cancer screening. / Every year if you are aged 52-80 years and have a 30-pack-year history of smoking and currently smoke or have quit within the past 15 years. Yearly screening is stopped once you have quit smoking for at least 15 years or develop a health problem that would prevent you from having lung cancer  treatment.  Clinical breast exam.** / Every year after age 59 years.  BRCA-related cancer risk assessment.** / For women who have family members with a BRCA-related cancer (breast, ovarian, tubal, or peritoneal cancers).  Mammogram.** / Every year beginning at age 40 years and continuing for as long as you are in good health. Consult with your health care provider.  Pap test.** / Every 3 years starting at age 86 years through age 70 or 35 years with 3 consecutive normal Pap tests. Testing can be stopped between 65 and 70 years with 3 consecutive normal Pap tests and no abnormal Pap or HPV tests in the past 10 years.  HPV screening.** / Every 3 years from ages 9 years through ages 60 or 58 years with a history of 3 consecutive normal Pap tests. Testing can be stopped between 65 and 70 years with 3 consecutive normal Pap tests and no abnormal Pap or HPV tests in the past 10 years.  Fecal occult blood test (FOBT) of stool. / Every year beginning at age 37 years and continuing until age 82 years. You may not need to do this test if you get a colonoscopy every 10 years.  Flexible sigmoidoscopy or colonoscopy.** / Every 5 years for a flexible sigmoidoscopy or every 10 years for a colonoscopy beginning at age 43 years and continuing until age 93 years.  Hepatitis C blood test.** / For all people born from [birth year] through 1965 and any individual with known risks for hepatitis C.  Osteoporosis screening.** / A one-time screening for women ages 47 years and over and women at risk for fractures or osteoporosis.  Skin self-exam. / Monthly.  Influenza vaccine. / Every year.  Tetanus, diphtheria, and acellular pertussis (Tdap/Td) vaccine.** / 1 dose of Td every 10 years.  Varicella vaccine.** / Consult your health care provider.  Zoster vaccine.** / 1 dose for adults aged 19 years or older.  Pneumococcal 13-valent conjugate (PCV13) vaccine.** / Consult your health care provider.  Pneumococcal  polysaccharide (PPSV23) vaccine.** / 1 dose for all adults aged 45 years and older.  Meningococcal vaccine.** / Consult your health care provider.  Hepatitis A vaccine.** / Consult your health care provider.  Hepatitis B vaccine.** / Consult your health care provider.  Haemophilus influenzae type b (Hib) vaccine.** / Consult your health care provider. ** Family history and personal history of risk and conditions may change your health care provider's recommendations. Document Released: 01/03/2002 Document Revised: 03/24/2014 Document Reviewed: 04/04/2011 [facility] Patient Information 2015 [place], [place]. This information is not intended to replace advice given to you by your health care provider. Make sure you discuss any questions you have with your health care provider.

## 2014-12-05 NOTE — Progress Notes (Signed)
  Subjective:     Brooke Donovan is a 24 y.o. female G2P0020 with LMP 11/26/2014 who is here for a comprehensive physical exam. The patient reports no problems. Patient is sexually active using OCP for contraception. She has been with the same partner for over 2 years. Patient reports full STD testing at the [facility] department in July 2015. She reports recurrent BV infections and would like repeat testing today.  History   Social History  . Marital Status: Single    Spouse Name: N/A    Number of Children: N/A  . Years of Education: N/A   Occupational History  . Not on file.   Social History Main Topics  . Smoking status: Never Smoker   . Smokeless tobacco: Never Used  . Alcohol Use: No  . Drug Use: No  . Sexual Activity: Yes    Birth Control/ Protection: Pill     Comment: Last intercourse July. Would like Depo shot.   Other Topics Concern  . Not on file   Social History Narrative   Health Maintenance  Topic Date Due  . CHLAMYDIA SCREENING  07/31/2006  . TETANUS/TDAP  07/31/2010  . INFLUENZA VACCINE  06/21/2014  . PAP SMEAR  12/04/2015   Past Medical History  Diagnosis Date  . Bacterial vaginal infection   . Ovarian cyst   . Chlamydia     2013  . Abnormal Pap smear    Past Surgical History  Procedure Laterality Date  . Arm surgery x3    . Wisdom tooth extraction    . Therapeutic abortion     History  Substance Use Topics  . Smoking status: Never Smoker   . Smokeless tobacco: Never Used  . Alcohol Use: No   Family History  Problem Relation Age of Onset  . Hypertension Mother   . Cancer Paternal Grandmother     colon  . Diabetes Neg Hx   . Heart disease Neg Hx   . Hearing loss Neg Hx   . Stroke Neg Hx        Review of Systems A comprehensive review of systems was negative.   Objective:      GENERAL: Well-developed, well-nourished female in no acute distress.  HEENT: Normocephalic, atraumatic. Sclerae anicteric.  NECK: Supple. Normal thyroid.   LUNGS: Clear to auscultation bilaterally.  HEART: Regular rate and rhythm. BREASTS: Symmetric in size. No palpable masses or lymphadenopathy, skin changes, or nipple drainage. ABDOMEN: Soft, nontender, nondistended. No organomegaly. PELVIC: Normal external female genitalia. Vagina is pink and rugated.  Normal discharge. Normal appearing cervix, friable. Uterus is normal in size. No adnexal mass or tenderness. EXTREMITIES: No cyanosis, clubbing, or edema, 2+ distal pulses.    Assessment:    Healthy female exam.      Plan:    24 yo here for annual exam - Pap smear with cultures collected - wet prep collected - Refill on OCP provided at patient request - patient will be contacted with any abnormal results - RTC in 1 year or prn See After Visit Summary for Counseling Recommendations

## 2014-12-06 LAB — WET PREP, GENITAL
Clue Cells Wet Prep HPF POC: NONE SEEN
Trich, Wet Prep: NONE SEEN
Yeast Wet Prep HPF POC: NONE SEEN

## 2014-12-09 LAB — CYTOLOGY - PAP

## 2014-12-15 ENCOUNTER — Encounter: Payer: Self-pay | Admitting: Obstetrics and Gynecology

## 2014-12-15 DIAGNOSIS — IMO0002 Reserved for concepts with insufficient information to code with codable children: Secondary | ICD-10-CM | POA: Insufficient documentation

## 2014-12-17 ENCOUNTER — Telehealth: Payer: Self-pay | Admitting: *Deleted

## 2014-12-17 NOTE — Telephone Encounter (Signed)
-----   Message from Vivien Rotaheryl A Clinton sent at 12/16/2014  3:20 PM EST ----- Appointment is 03/02 @1 :15  ----- Message -----    From: Faith RogueAmanda Kaytlyn Din Rash, LPN    Sent: 2/95/62131/25/2016   8:30 AM      To: [facility] Admin Pool  Please schedule colpo and send back to clinical pool to contact patient.  Thanks!  ----- Message -----    From: Catalina AntiguaPeggy Constant, MD    Sent: 12/15/2014   6:23 AM      To: [facility] Clinical Pool  Please inform patient of abnormal pap smear and need for colposcopy. Please schedule colpo appointment  Thanks  peggy

## 2014-12-17 NOTE — Telephone Encounter (Signed)
Called patient and informed her of results and appointment. She voiced understanding and had no questions.

## 2014-12-22 ENCOUNTER — Other Ambulatory Visit: Payer: Self-pay | Admitting: *Deleted

## 2014-12-22 DIAGNOSIS — Z308 Encounter for other contraceptive management: Secondary | ICD-10-CM

## 2014-12-22 MED ORDER — NORETHIN-ETH ESTRAD TRIPHASIC 0.5/0.75/1-35 MG-MCG PO TABS: 1.0000 | ORAL_TABLET | Freq: Every day | ORAL | Status: DC

## 2014-12-22 NOTE — Telephone Encounter (Signed)
Pharmacy sent request for refill of patients ocp.

## 2015-01-21 ENCOUNTER — Encounter: Admitting: Obstetrics and Gynecology

## 2015-02-23 ENCOUNTER — Other Ambulatory Visit (HOSPITAL_COMMUNITY)
Admission: RE | Admit: 2015-02-23 | Discharge: 2015-02-23 | Disposition: A | Discharge status: Home / Self Care | Source: Ambulatory Visit | Attending: Obstetrics and Gynecology | Admitting: Obstetrics and Gynecology

## 2015-02-23 ENCOUNTER — Ambulatory Visit (INDEPENDENT_AMBULATORY_CARE_PROVIDER_SITE_OTHER): Admitting: Obstetrics and Gynecology

## 2015-02-23 ENCOUNTER — Encounter: Payer: Self-pay | Admitting: Obstetrics and Gynecology

## 2015-02-23 VITALS — BP 119/80 | HR 61 | Ht 71.0 in | Wt 203.9 lb

## 2015-02-23 DIAGNOSIS — R87619 Unspecified abnormal cytological findings in specimens from cervix uteri: Secondary | ICD-10-CM | POA: Diagnosis not present

## 2015-02-23 DIAGNOSIS — R896 Abnormal cytological findings in specimens from other organs, systems and tissues: Secondary | ICD-10-CM | POA: Diagnosis not present

## 2015-02-23 DIAGNOSIS — IMO0002 Reserved for concepts with insufficient information to code with codable children: Secondary | ICD-10-CM

## 2015-02-23 LAB — POCT PREGNANCY, URINE: Preg Test, Ur: NEGATIVE

## 2015-02-23 NOTE — Progress Notes (Signed)
Patient ID: Brooke Donovan, female   DOB: 07-08-1991, 24 y.o.   MRN: [id number] 24 yo G0 here for colposcopy. 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 Patient reports that she completed the Gardasil series  Patient given informed consent, signed copy in the chart, time out was performed.  Placed in lithotomy position. Cervix viewed with speculum and colposcope after application of acetic acid.   Colposcopy adequate?  No. TZ not visualized Acetowhite lesions?yes at 5 and 8 o'clock Punctation?no Mosaicism?  no Abnormal vasculature?  no Biopsies?yes 5 and 8 o'clock ECC?yes  COMMENTS: Patient was given post procedure instructions.  She will return in 2 weeks for results.

## 2015-02-24 ENCOUNTER — Telehealth: Payer: Self-pay

## 2015-02-24 NOTE — Telephone Encounter (Signed)
Attempted to contact patient. No answer. Left message stating we are calling with results, please call clinic.  

## 2015-02-24 NOTE — Telephone Encounter (Signed)
Patient returned call for results. Informed her of results and recommendations. Patient states she has had the Guardasil series. Advised patient to call clinic in September to schedule appointment for repeat pap in October 2016. Patient verbalized understanding and gratitude. No questions or concerns.

## 2015-02-24 NOTE — Telephone Encounter (Signed)
-----   Message from Catalina AntiguaPeggy Constant, MD sent at 02/24/2015  2:22 PM EDT ----- Please inform patient of biopsy results consistent with pap smear. Will need repeat pap smear in 6 months. Encourage Gardasil if not already received  Thanks  Kinder Morgan EnergyPeggy

## 2015-03-25 ENCOUNTER — Encounter (HOSPITAL_COMMUNITY): Payer: Self-pay

## 2015-03-25 ENCOUNTER — Emergency Department (INDEPENDENT_AMBULATORY_CARE_PROVIDER_SITE_OTHER)
Admission: EM | Admit: 2015-03-25 | Discharge: 2015-03-25 | Disposition: A | Discharge status: Home / Self Care | Source: Home / Self Care | Attending: Family Medicine | Admitting: Family Medicine

## 2015-03-25 DIAGNOSIS — J020 Streptococcal pharyngitis: Secondary | ICD-10-CM | POA: Diagnosis not present

## 2015-03-25 LAB — POCT RAPID STREP A: Streptococcus, Group A Screen (Direct): POSITIVE — AB

## 2015-03-25 MED ORDER — AMOXICILLIN 500 MG PO CAPS: 500.0000 mg | ORAL_CAPSULE | Freq: Three times a day (TID) | ORAL | Status: DC

## 2015-03-25 NOTE — Discharge Instructions (Signed)
Thank you for coming in today. °Call or go to the emergency room if you get worse, have trouble breathing, have chest pains, or palpitations.  ° °Strep Throat °Strep throat is an infection of the throat caused by a bacteria named Streptococcus pyogenes. Your health care provider may call the infection streptococcal "tonsillitis" or "pharyngitis" depending on whether there are signs of inflammation in the tonsils or back of the throat. Strep throat is most common in children aged 5-15 years during the cold months of the year, but it can occur in people of any age during any season. This infection is spread from person to person (contagious) through coughing, sneezing, or other close contact. °SIGNS AND SYMPTOMS  °· Fever or chills. °· Painful, swollen, red tonsils or throat. °· Pain or difficulty when swallowing. °· White or yellow spots on the tonsils or throat. °· Swollen, tender lymph nodes or "glands" of the neck or under the jaw. °· Red rash all over the body (rare). °DIAGNOSIS  °Many different infections can cause the same symptoms. A test must be done to confirm the diagnosis so the right treatment can be given. A "rapid strep test" can help your health care provider make the diagnosis in a few minutes. If this test is not available, a light swab of the infected area can be used for a throat culture test. If a throat culture test is done, results are usually available in a day or two. °TREATMENT  °Strep throat is treated with antibiotic medicine. °HOME CARE INSTRUCTIONS  °· Gargle with 1 tsp of salt in 1 cup of warm water, 3-4 times per day or as needed for comfort. °· Family members who also have a sore throat or fever should be tested for strep throat and treated with antibiotics if they have the strep infection. °· Make sure everyone in your household washes their hands well. °· Do not share food, drinking cups, or personal items that could cause the infection to spread to others. °· You may need to eat a  soft food diet until your sore throat gets better. °· Drink enough water and fluids to keep your urine clear or pale yellow. This will help prevent dehydration. °· Get plenty of rest. °· Stay home from school, day care, or work until you have been on antibiotics for 24 hours. °· Take medicines only as directed by your health care provider. °· Take your antibiotic medicine as directed by your health care provider. Finish it even if you start to feel better. °SEEK MEDICAL CARE IF:  °· The glands in your neck continue to enlarge. °· You develop a rash, cough, or earache. °· You cough up green, yellow-brown, or bloody sputum. °· You have pain or discomfort not controlled by medicines. °· Your problems seem to be getting worse rather than better. °· You have a fever. °SEEK IMMEDIATE MEDICAL CARE IF:  °· You develop any new symptoms such as vomiting, severe headache, stiff or painful neck, chest pain, shortness of breath, or trouble swallowing. °· You develop severe throat pain, drooling, or changes in your voice. °· You develop swelling of the neck, or the skin on the neck becomes red and tender. °· You develop signs of dehydration, such as fatigue, dry mouth, and decreased urination. °· You become increasingly sleepy, or you cannot wake up completely. °MAKE SURE YOU: °· Understand these instructions. °· Will watch your condition. °· Will get help right away if you are not doing well or get worse. °  Document Released: 11/04/2000 Document Revised: 03/24/2014 Document Reviewed: 01/06/2011 °ExitCare® Patient Information ©2015 [employer]. This information is not intended to replace advice given to you by your health care provider. Make sure you discuss any questions you have with your health care provider. ° °

## 2015-03-25 NOTE — ED Provider Notes (Signed)
Brooke Donovan is a 24 y.o. female who presents to Urgent Care today for sore throat and runny nose and congestion. Symptoms present for one week. No vomiting diarrhea. She's tried multiple over-the-counter medications which have helped only a little. Symptoms are quite bothersome.   Past Medical History  Diagnosis Date  . Bacterial vaginal infection   . Ovarian cyst   . Chlamydia     2013  . Abnormal Pap smear    Past Surgical History  Procedure Laterality Date  . Arm surgery x3    . Wisdom tooth extraction    . Therapeutic abortion     History  Substance Use Topics  . Smoking status: Never Smoker   . Smokeless tobacco: Never Used  . Alcohol Use: No   ROS as above Medications: No current facility-administered medications for this encounter.   Current Outpatient Prescriptions  Medication Sig Dispense Refill  . norethindrone-ethinyl estradiol (CYCLAFEM,ALYACEN) 0.5/0.75/1-35 MG-MCG tablet Take 1 tablet by mouth daily. 1 Package 11  . amoxicillin (AMOXIL) 500 MG capsule Take 1 capsule (500 mg total) by mouth 3 (three) times daily. 21 capsule 0  . Multiple Vitamins-Minerals (MULTIVITAMIN PO) Take 1 tablet by mouth daily.    . [DISCONTINUED] Norethindrone-Eth Estradiol (BALZIVA PO) Take by mouth.     No Known Allergies   Exam:  BP 116/81 mmHg  Pulse 79  Temp(Src) 99.1 F (37.3 C) (Oral)  Resp 16  SpO2 99%  LMP 02/17/2015 (Exact Date) Gen: Well NAD HEENT: EOMI,  MMM posterior pharynx is erythematous normal tympanic membranes bilaterally Lungs: Normal work of breathing. CTABL Heart: RRR no MRG Abd: NABS, Soft. Nondistended, Nontender Exts: Brisk capillary refill, warm and well perfused.   Results for orders placed or performed during the hospital encounter of 03/25/15 (from the past 24 hour(s))  POCT rapid strep A [facility])     Status: Abnormal   Collection Time: 03/25/15  9:46 AM  Result Value Ref Range   Streptococcus, Group A Screen (Direct) POSITIVE (A)  NEGATIVE   No results found.  Assessment and Plan: 24 y.o. female with strep throat treat with amoxicillin. Return as needed.  Discussed warning signs or symptoms. Please see discharge instructions. Patient expresses understanding.     Rodolph BongEvan S Ennifer Harston, MD 03/25/15 [phone]

## 2015-03-25 NOTE — ED Notes (Signed)
C/o has had 1 week duration of ST, congestion. Minimal relief w OTC medications

## 2015-06-22 ENCOUNTER — Inpatient Hospital Stay (HOSPITAL_COMMUNITY)
Admission: AD | Admit: 2015-06-22 | Discharge: 2015-06-22 | Disposition: A | Discharge status: Home / Self Care | Source: Ambulatory Visit | Attending: Family Medicine | Admitting: Family Medicine

## 2015-06-22 ENCOUNTER — Encounter (HOSPITAL_COMMUNITY): Payer: Self-pay | Admitting: *Deleted

## 2015-06-22 DIAGNOSIS — N760 Acute vaginitis: Secondary | ICD-10-CM | POA: Diagnosis not present

## 2015-06-22 DIAGNOSIS — A499 Bacterial infection, unspecified: Secondary | ICD-10-CM | POA: Diagnosis not present

## 2015-06-22 DIAGNOSIS — B9689 Other specified bacterial agents as the cause of diseases classified elsewhere: Secondary | ICD-10-CM

## 2015-06-22 DIAGNOSIS — N898 Other specified noninflammatory disorders of vagina: Secondary | ICD-10-CM | POA: Diagnosis present

## 2015-06-22 LAB — URINALYSIS, ROUTINE W REFLEX MICROSCOPIC
Bilirubin Urine: NEGATIVE
Glucose, UA: NEGATIVE mg/dL
Hgb urine dipstick: NEGATIVE
Ketones, ur: NEGATIVE mg/dL
Nitrite: NEGATIVE
Protein, ur: NEGATIVE mg/dL
Specific Gravity, Urine: 1.02 (ref 1.005–1.030)
Urobilinogen, UA: 0.2 mg/dL (ref 0.0–1.0)
pH: 6 (ref 5.0–8.0)

## 2015-06-22 LAB — WET PREP, GENITAL
Trich, Wet Prep: NONE SEEN
Yeast Wet Prep HPF POC: NONE SEEN

## 2015-06-22 LAB — URINE MICROSCOPIC-ADD ON

## 2015-06-22 LAB — CBC WITH DIFFERENTIAL/PLATELET
Basophils Absolute: 0 10*3/uL (ref 0.0–0.1)
Basophils Relative: 0 % (ref 0–1)
Eosinophils Absolute: 0.1 10*3/uL (ref 0.0–0.7)
Eosinophils Relative: 1 % (ref 0–5)
HCT: 37.2 % (ref 36.0–46.0)
Hemoglobin: 12.3 g/dL (ref 12.0–15.0)
Lymphocytes Relative: 47 % — ABNORMAL HIGH (ref 12–46)
Lymphs Abs: 3.4 10*3/uL (ref 0.7–4.0)
MCH: 29 pg (ref 26.0–34.0)
MCHC: 33.1 g/dL (ref 30.0–36.0)
MCV: 87.7 fL (ref 78.0–100.0)
Monocytes Absolute: 0.4 10*3/uL (ref 0.1–1.0)
Monocytes Relative: 6 % (ref 3–12)
Neutro Abs: 3.2 10*3/uL (ref 1.7–7.7)
Neutrophils Relative %: 46 % (ref 43–77)
Platelets: 199 10*3/uL (ref 150–400)
RBC: 4.24 MIL/uL (ref 3.87–5.11)
RDW: 13.3 % (ref 11.5–15.5)
WBC: 7 10*3/uL (ref 4.0–10.5)

## 2015-06-22 LAB — POCT PREGNANCY, URINE: Preg Test, Ur: NEGATIVE

## 2015-06-22 MED ORDER — METRONIDAZOLE 500 MG PO TABS: 500.0000 mg | ORAL_TABLET | Freq: Two times a day (BID) | ORAL | Status: DC

## 2015-06-22 NOTE — Discharge Instructions (Signed)
Bacterial Vaginosis Bacterial vaginosis is a vaginal infection that occurs when the normal balance of bacteria in the vagina is disrupted. It results from an overgrowth of certain bacteria. This is the most common vaginal infection in women of childbearing age. Treatment is important to prevent complications, especially in pregnant women, as it can cause a premature delivery. CAUSES  Bacterial vaginosis is caused by an increase in harmful bacteria that are normally present in smaller amounts in the vagina. Several different kinds of bacteria can cause bacterial vaginosis. However, the reason that the condition develops is not fully understood. RISK FACTORS Certain activities or behaviors can put you at an increased risk of developing bacterial vaginosis, including:  Having a new sex partner or multiple sex partners.  Douching.  Using an intrauterine device (IUD) for contraception. Women do not get bacterial vaginosis from toilet seats, bedding, swimming pools, or contact with objects around them. SIGNS AND SYMPTOMS  Some women with bacterial vaginosis have no signs or symptoms. Common symptoms include:  Grey vaginal discharge.  A fishlike odor with discharge, especially after sexual intercourse.  Itching or burning of the vagina and vulva.  Burning or pain with urination. DIAGNOSIS  Your health care provider will take a medical history and examine the vagina for signs of bacterial vaginosis. A sample of vaginal fluid may be taken. Your health care provider will look at this sample under a microscope to check for bacteria and abnormal cells. A vaginal pH test may also be done.  TREATMENT  Bacterial vaginosis may be treated with antibiotic medicines. These may be given in the form of a pill or a vaginal cream. A second round of antibiotics may be prescribed if the condition comes back after treatment.  HOME CARE INSTRUCTIONS   Only take over-the-counter or prescription medicines as  directed by your health care provider.  If antibiotic medicine was prescribed, take it as directed. Make sure you finish it even if you start to feel better.  Do not have sex until treatment is completed.  Tell all sexual partners that you have a vaginal infection. They should see their health care provider and be treated if they have problems, such as a mild rash or itching.  Practice safe sex by using condoms and only having one sex partner. SEEK MEDICAL CARE IF:   Your symptoms are not improving after 3 days of treatment.  You have increased discharge or pain.  You have a fever. MAKE SURE YOU:   Understand these instructions.  Will watch your condition.  Will get help right away if you are not doing well or get worse. FOR MORE INFORMATION  Centers for Disease Control and Prevention, Division of STD Prevention: www.cdc.gov/std American Sexual Health Association (ASHA): www.ashastd.org  Document Released: 11/07/2005 Document Revised: 08/28/2013 Document Reviewed: 06/19/2013 ExitCare Patient Information 2015 [employer]. This information is not intended to replace advice given to you by your health care provider. Make sure you discuss any questions you have with your health care provider.  

## 2015-06-22 NOTE — MAU Note (Signed)
Pelvic pain worse on right for 3-4 days.  Has hx of BV, has white discharge with slight odor.

## 2015-06-22 NOTE — MAU Provider Note (Signed)
History     CSN: [id number]  Arrival date and time: 06/22/15 1141   First Provider Initiated Contact with Patient 06/22/15 1242      Chief Complaint  Patient presents with  . Pelvic Pain   HPI  Brooke Donovan is a 24 y.o. G2P0020 who presents to MAU today with complaint of vaginal discharge and right sided suprapubic pain. She states pain is 5/10 now. It is intermittent and sharp. She has had a clear and sometimes yellow discharge with mild odor. She denies itching, vaginal bleeding, fever, N/V/D or UTI symptoms. She has not taken anything for pain.   OB History    Gravida Para Term Preterm AB TAB SAB Ectopic Multiple Living   2    2 2     0      Past Medical History  Diagnosis Date  . Bacterial vaginal infection   . Ovarian cyst   . Chlamydia     2013  . Abnormal Pap smear     Past Surgical History  Procedure Laterality Date  . Arm surgery x3    . Wisdom tooth extraction    . Therapeutic abortion      Family History  Problem Relation Age of Onset  . Hypertension Mother   . Cancer Paternal Grandmother     colon  . Diabetes Neg Hx   . Heart disease Neg Hx   . Hearing loss Neg Hx   . Stroke Neg Hx     History  Substance Use Topics  . Smoking status: Never Smoker   . Smokeless tobacco: Never Used  . Alcohol Use: No    Allergies: No Known Allergies  No prescriptions prior to admission    Review of Systems  Constitutional: Negative for fever and malaise/fatigue.  Gastrointestinal: Positive for abdominal pain. Negative for nausea, vomiting, diarrhea and constipation.  Genitourinary: Negative for dysuria, urgency and frequency.       + vaginal discharge Neg -vaginal bleeding   Physical Exam   Blood pressure 125/83, pulse 64, temperature 98.7 F (37.1 C), temperature source Oral, resp. rate 16, height 5\' 10"  (1.778 m), weight 202 lb 6 oz (91.797 kg), last menstrual period 06/16/2015, SpO2 100 %.  Physical Exam  Nursing note and vitals  reviewed. Constitutional: She is oriented to person, place, and time. She appears well-developed and well-nourished. No distress.  HENT:  Head: Normocephalic and atraumatic.  Cardiovascular: Normal rate.   Respiratory: Effort normal.  GI: Soft. She exhibits no distension and no mass. There is no tenderness. There is no rebound and no guarding.  Genitourinary: Uterus is not enlarged and not tender. Cervix exhibits no motion tenderness, no discharge and no friability. Right adnexum displays tenderness (mild). Right adnexum displays no mass. Left adnexum displays no mass and no tenderness. No bleeding in the vagina. Vaginal discharge (moderate amount of thick, white discharge noted) found.  Neurological: She is alert and oriented to person, place, and time.  Skin: Skin is warm and dry. No erythema.  Psychiatric: She has a normal mood and affect.    Results for orders placed or performed during the hospital encounter of 06/22/15 (from the past 24 hour(s))  Urinalysis, Routine w reflex microscopic (not at [facility])     Status: Abnormal   Collection Time: 06/22/15 12:05 PM  Result Value Ref Range   Color, Urine YELLOW YELLOW   APPearance CLEAR CLEAR   Specific Gravity, Urine 1.020 1.005 - 1.030   pH 6.0 5.0 - 8.0  Glucose, UA NEGATIVE NEGATIVE mg/dL   Hgb urine dipstick NEGATIVE NEGATIVE   Bilirubin Urine NEGATIVE NEGATIVE   Ketones, ur NEGATIVE NEGATIVE mg/dL   Protein, ur NEGATIVE NEGATIVE mg/dL   Urobilinogen, UA 0.2 0.0 - 1.0 mg/dL   Nitrite NEGATIVE NEGATIVE   Leukocytes, UA TRACE (A) NEGATIVE  Urine microscopic-add on     Status: Abnormal   Collection Time: 06/22/15 12:05 PM  Result Value Ref Range   Squamous Epithelial / LPF FEW (A) RARE   WBC, UA 0-2 <3 WBC/hpf   RBC / HPF 0-2 <3 RBC/hpf   Bacteria, UA RARE RARE  Pregnancy, urine POC     Status: None   Collection Time: 06/22/15 12:32 PM  Result Value Ref Range   Preg Test, Ur NEGATIVE NEGATIVE  Wet prep, genital     Status:  Abnormal   Collection Time: 06/22/15 12:39 PM  Result Value Ref Range   Yeast Wet Prep HPF POC NONE SEEN NONE SEEN   Trich, Wet Prep NONE SEEN NONE SEEN   Clue Cells Wet Prep HPF POC MODERATE (A) NONE SEEN   WBC, Wet Prep HPF POC FEW (A) NONE SEEN  CBC with Differential/Platelet     Status: Abnormal   Collection Time: 06/22/15 12:50 PM  Result Value Ref Range   WBC 7.0 4.0 - 10.5 K/uL   RBC 4.24 3.87 - 5.11 MIL/uL   Hemoglobin 12.3 12.0 - 15.0 g/dL   HCT 78.2 95.6 - 21.3 %   MCV 87.7 78.0 - 100.0 fL   MCH 29.0 26.0 - 34.0 pg   MCHC 33.1 30.0 - 36.0 g/dL   RDW 08.6 57.8 - 46.9 %   Platelets 199 150 - 400 K/uL   Neutrophils Relative % 46 43 - 77 %   Neutro Abs 3.2 1.7 - 7.7 K/uL   Lymphocytes Relative 47 (H) 12 - 46 %   Lymphs Abs 3.4 0.7 - 4.0 K/uL   Monocytes Relative 6 3 - 12 %   Monocytes Absolute 0.4 0.1 - 1.0 K/uL   Eosinophils Relative 1 0 - 5 %   Eosinophils Absolute 0.1 0.0 - 0.7 K/uL   Basophils Relative 0 0 - 1 %   Basophils Absolute 0.0 0.0 - 0.1 K/uL     MAU Course  Procedures None  MDM UPT - negative UA, wet prep, GC/Chlamydia, CBC, HIV and RPR today CBC without leukocytosis, patient afebrile and without N/V - low suspicion for appendicitis  Assessment and Plan  A: Bacterial Vaginosis  P: Discharge home Rx for Flagyl given to patient Ibuprofen PRN for pain advised Warning signs for worsening condition and hygiene products to avoid for recurrence discussed Patient advised to follow-up with PCP of choice as needed  Patient may return to MAU as needed or if her condition were to change or worsen   Marny Lowenstein, PA-C  06/22/2015, 2:06 PM

## 2015-06-23 LAB — GC/CHLAMYDIA PROBE AMP (~~LOC~~) NOT AT ARMC
Chlamydia: NEGATIVE
Neisseria Gonorrhea: NEGATIVE

## 2015-06-23 LAB — RPR: RPR Ser Ql: NONREACTIVE

## 2015-06-23 LAB — HIV ANTIBODY (ROUTINE TESTING W REFLEX): HIV Screen 4th Generation wRfx: NONREACTIVE

## 2015-08-24 ENCOUNTER — Ambulatory Visit (INDEPENDENT_AMBULATORY_CARE_PROVIDER_SITE_OTHER): Admitting: Family Medicine

## 2015-08-24 ENCOUNTER — Encounter: Payer: Self-pay | Admitting: Family Medicine

## 2015-08-24 VITALS — BP 142/81 | HR 82 | Ht 70.0 in | Wt 201.6 lb

## 2015-08-24 DIAGNOSIS — R896 Abnormal cytological findings in specimens from other organs, systems and tissues: Secondary | ICD-10-CM | POA: Diagnosis not present

## 2015-08-24 DIAGNOSIS — IMO0002 Reserved for concepts with insufficient information to code with codable children: Secondary | ICD-10-CM

## 2015-08-24 DIAGNOSIS — Z124 Encounter for screening for malignant neoplasm of cervix: Secondary | ICD-10-CM

## 2015-08-24 NOTE — Progress Notes (Signed)
   Subjective:    Patient ID: Brooke Donovan is a 24 y.o. female presenting with Follow-up  on 08/24/2015  HPI: Patient is here for f/u pap. Prev. Pap showed AS-CUS with + HPV. Colpo showed koilocytic atypia. No issues. Had some breakthrough bleeding which has resolved.  Review of Systems  Constitutional: Negative for fever and chills.  Respiratory: Negative for chest tightness and shortness of breath.   Cardiovascular: Negative for chest pain.  Gastrointestinal: Negative for abdominal pain.  Genitourinary: Negative for dysuria.  All other systems reviewed and are negative.     Objective:    BP 142/81 mmHg  Pulse 82  Ht  (1.778 m)  Wt 201 lb 9.6 oz (91.445 kg)  BMI 28.93 kg/m2  LMP 08/03/2015 Physical Exam  Constitutional: She is oriented to person, place, and time. She appears well-developed and well-nourished. No distress.  HENT:  Head: Normocephalic and atraumatic.  Eyes: No scleral icterus.  Neck: Neck supple.  Cardiovascular: Normal rate.   Pulmonary/Chest: Effort normal.  Abdominal: Soft.  Genitourinary:  NEFG, vagina is pink and ruggated, cervix is nulliparous and without lesion  Neurological: She is alert and oriented to person, place, and time.  Skin: Skin is warm and dry.  Psychiatric: She has a normal mood and affect.        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      Relevant Orders   Cytology - PAP       Return in about 6 months (around 02/22/2016) for a CPE.  Jackalynn Art S 08/24/2015 4:16 PM

## 2015-08-24 NOTE — Patient Instructions (Signed)
Abnormal Pap Test Information During a Pap test, the cells on the surface of your cervix are checked to see if they look normal, abnormal, or if they show signs of having been altered by a certain type of virus called human papillomavirus, or HPV. Cervical cells that have been affected by HPV are called dysplasia. Dysplasia is not cancer, but describes abnormal cells found on the surface of the cervix. Depending on the degree of dysplasia, some of the cells may be considered pre-cancerous and may turn into cancer over time if follow up with a caregiver is delayed.  WHAT DOES AN ABNORMAL PAP TEST MEAN? Having an abnormal pap test does not mean that you have cancer. However, certain types of abnormal pap tests can be a sign that a person is at a higher risk of developing cancer. Your caregiver will want to do other tests to find out more about the abnormal cells. Your abnormal Pap test results could show:   Small and uncertain changes that should be carefully watched.   Cervical dysplasia that has caused mild changes and can be followed over time.  Cervical dysplasia that is more severe and needs to be followed and treated to ensure the problem goes away.  Cancer.  When severe cervical dysplasia is found and treated early, it rarely will grow into cancer.  WHAT WILL BE DONE ABOUT MY ABNORMAL PAP TEST?  A colposcopy may be needed. This is a procedure where your cervix is examined using light and magnification.  A small tissue sample of your cervix (biopsy) may need to be removed and then examined. This is often performed if there are areas that appear infected.  A sample of cells from the cervical canal may be removed with either a small brush or scraping instrument (curette). Based on the results of the procedures above, some caregivers may recommend either cryotherapy of the cervix or a surgical LEEP where a portion of the cervix is removed. LEEP is short for "loop electrical excisional  procedure." Rarely, a caregiver may recommend a cone biopsy.This is a procedure where a small, cone-shaped sample of your cervix is taken out. The part that is taken out is the area where the abnormal cells are.  WHAT IF I HAVE A DYSPLASIA OR A CANCER? You may be referred to a specialist. Radiation may also be a treatment for more advanced cancer. Having a hysterectomy is the last treatment option for dysplasia, but it is a more common treatment for someone with cancer. All treatment options will be discussed with you by your caregiver. WHAT SHOULD YOU DO AFTER BEING TREATED? If you have had an abnormal pap test, you should continue to have regular pap tests and check-ups as directed by your caregiver. Your cervical problem will be carefully watched so it does not get worse. Also, your caregiver can watch for, and treat, any new problems that may come up. Document Released: 02/22/2011 Document Revised: 03/04/2013 Document Reviewed: 11/03/2011 ExitCare Patient Information 2015 [employer]. This information is not intended to replace advice given to you by your health care provider. Make sure you discuss any questions you have with your health care provider.  

## 2015-08-24 NOTE — Assessment & Plan Note (Signed)
For re-pap today and in 6 months.

## 2015-08-26 LAB — CYTOLOGY - PAP

## 2015-11-30 ENCOUNTER — Telehealth: Payer: Self-pay | Admitting: *Deleted

## 2015-11-30 ENCOUNTER — Other Ambulatory Visit: Payer: Self-pay

## 2015-11-30 DIAGNOSIS — Z308 Encounter for other contraceptive management: Secondary | ICD-10-CM

## 2015-11-30 MED ORDER — NORETHIN-ETH ESTRAD TRIPHASIC 0.5/0.75/1-35 MG-MCG PO TABS: 1.0000 | ORAL_TABLET | Freq: Every day | ORAL | Status: DC

## 2015-11-30 NOTE — Telephone Encounter (Signed)
rx called in

## 2015-11-30 NOTE — Telephone Encounter (Signed)
[facility] left message on [employer]aron Donovan's voicemail.  States patient needs a refill on her birth control.  [phone].

## 2016-03-07 ENCOUNTER — Ambulatory Visit (INDEPENDENT_AMBULATORY_CARE_PROVIDER_SITE_OTHER): Admitting: Obstetrics and Gynecology

## 2016-03-07 ENCOUNTER — Encounter: Payer: Self-pay | Admitting: Obstetrics and Gynecology

## 2016-03-07 VITALS — BP 118/84 | HR 59 | Wt 201.6 lb

## 2016-03-07 DIAGNOSIS — Z124 Encounter for screening for malignant neoplasm of cervix: Secondary | ICD-10-CM

## 2016-03-07 DIAGNOSIS — Z1151 Encounter for screening for human papillomavirus (HPV): Secondary | ICD-10-CM | POA: Diagnosis not present

## 2016-03-07 DIAGNOSIS — Z01419 Encounter for gynecological examination (general) (routine) without abnormal findings: Secondary | ICD-10-CM | POA: Diagnosis not present

## 2016-03-07 LAB — POCT PREGNANCY, URINE: Preg Test, Ur: NEGATIVE

## 2016-03-07 NOTE — Progress Notes (Deleted)
Patient ID: Brooke Donovan, female   DOB: [birth year], 25 y.o.   MRN: [id number] Patient is here for f/u pap. Prev. Pap showed AS-CUS with + HPV (11/2014). Colpo showed koilocytic atypia. Patient had a normal pap smear in 08/2015. Patient is without any other complaints. ***

## 2016-03-07 NOTE — Progress Notes (Signed)
  Subjective:     Brooke Donovan is a 25 y.o. female [id number] with BMI 29 who is here for a comprehensive physical exam. The patient reports no problems. She is sexually active using OCP for contraception.   Past Medical History  Diagnosis Date  . Bacterial vaginal infection   . Ovarian cyst   . Chlamydia     2013  . Abnormal Pap smear    Past Surgical History  Procedure Laterality Date  . Arm surgery x3    . Wisdom tooth extraction    . Therapeutic abortion     Family History  Problem Relation Age of Onset  . Hypertension Mother   . Cancer Paternal Grandmother     colon  . Diabetes Neg Hx   . Heart disease Neg Hx   . Hearing loss Neg Hx   . Stroke Neg Hx     Social History   Social History  . Marital Status: Single    Spouse Name: N/A  . Number of Children: N/A  . Years of Education: N/A   Occupational History  . Not on file.   Social History Main Topics  . Smoking status: Never Smoker   . Smokeless tobacco: Never Used  . Alcohol Use: No  . Drug Use: No  . Sexual Activity: Yes    Birth Control/ Protection: Pill     Comment: Last intercourse July. Would like Depo shot.   Other Topics Concern  . Not on file   Social History Narrative   Health Maintenance  Topic Date Due  . Janet BerlinETANUS/TDAP  07/31/2010  . INFLUENZA VACCINE  06/21/2016  . PAP SMEAR  08/23/2018  . HIV Screening  Completed       Review of Systems Pertinent items are noted in HPI.   Objective:  Blood pressure 118/84, pulse 59, weight 201 lb 9.6 oz (91.445 kg).     GENERAL: Well-developed, well-nourished female in no acute distress.  HEENT: Normocephalic, atraumatic. Sclerae anicteric.  NECK: Supple. Normal thyroid.  LUNGS: Clear to auscultation bilaterally.  HEART: Regular rate and rhythm. BREASTS: Symmetric in size. No palpable masses or lymphadenopathy, skin changes, or nipple drainage. ABDOMEN: Soft, nontender, nondistended. No organomegaly. PELVIC: Normal external female  genitalia. Vagina is pink and rugated.  Normal discharge. Normal appearing cervix. Uterus is normal in size. No adnexal mass or tenderness. EXTREMITIES: No cyanosis, clubbing, or edema, 2+ distal pulses.  Assessment:    Healthy female exam.      Plan:    Pap smear with cultures collected today Patient will be contacted with any abnormal results Patient reports normal STD screen at the health department a few weeks ago Patient states she received the Gardasil vaccine series See After Visit Summary for Counseling Recommendations

## 2016-03-09 LAB — CYTOLOGY - PAP

## 2016-07-20 ENCOUNTER — Encounter (HOSPITAL_COMMUNITY): Payer: Self-pay

## 2016-07-20 ENCOUNTER — Inpatient Hospital Stay (HOSPITAL_COMMUNITY)

## 2016-07-20 ENCOUNTER — Inpatient Hospital Stay (HOSPITAL_COMMUNITY)
Admission: AD | Admit: 2016-07-20 | Discharge: 2016-07-20 | Disposition: A | Discharge status: Home / Self Care | Source: Ambulatory Visit | Attending: Obstetrics & Gynecology | Admitting: Obstetrics & Gynecology

## 2016-07-20 DIAGNOSIS — Z79899 Other long term (current) drug therapy: Secondary | ICD-10-CM | POA: Insufficient documentation

## 2016-07-20 DIAGNOSIS — N838 Other noninflammatory disorders of ovary, fallopian tube and broad ligament: Secondary | ICD-10-CM

## 2016-07-20 DIAGNOSIS — R102 Pelvic and perineal pain: Secondary | ICD-10-CM | POA: Insufficient documentation

## 2016-07-20 DIAGNOSIS — N949 Unspecified condition associated with female genital organs and menstrual cycle: Secondary | ICD-10-CM

## 2016-07-20 DIAGNOSIS — R109 Unspecified abdominal pain: Secondary | ICD-10-CM | POA: Diagnosis present

## 2016-07-20 LAB — URINALYSIS, ROUTINE W REFLEX MICROSCOPIC
Bilirubin Urine: NEGATIVE
Glucose, UA: NEGATIVE mg/dL
Hgb urine dipstick: NEGATIVE
Ketones, ur: NEGATIVE mg/dL
Leukocytes, UA: NEGATIVE
Nitrite: NEGATIVE
Protein, ur: NEGATIVE mg/dL
Specific Gravity, Urine: 1.02 (ref 1.005–1.030)
pH: 6 (ref 5.0–8.0)

## 2016-07-20 LAB — WET PREP, GENITAL
Clue Cells Wet Prep HPF POC: NONE SEEN
Sperm: NONE SEEN
Trich, Wet Prep: NONE SEEN
Yeast Wet Prep HPF POC: NONE SEEN

## 2016-07-20 LAB — CBC
HCT: 36.1 % (ref 36.0–46.0)
Hemoglobin: 12 g/dL (ref 12.0–15.0)
MCH: 28.5 pg (ref 26.0–34.0)
MCHC: 33.2 g/dL (ref 30.0–36.0)
MCV: 85.7 fL (ref 78.0–100.0)
Platelets: 200 10*3/uL (ref 150–400)
RBC: 4.21 MIL/uL (ref 3.87–5.11)
RDW: 13.3 % (ref 11.5–15.5)
WBC: 10.6 10*3/uL — ABNORMAL HIGH (ref 4.0–10.5)

## 2016-07-20 LAB — POCT PREGNANCY, URINE: Preg Test, Ur: NEGATIVE

## 2016-07-20 MED ORDER — KETOROLAC TROMETHAMINE 60 MG/2ML IM SOLN
60.0000 mg | Freq: Once | INTRAMUSCULAR | Status: AC
  Administered 2016-07-20: 60 mg via INTRAMUSCULAR
  Filled 2016-07-20: qty 2

## 2016-07-20 NOTE — Discharge Instructions (Signed)

## 2016-07-20 NOTE — MAU Note (Signed)
Having severe pain across lower abd. Started last night. Was trying to have intercourse, when pain got bad.

## 2016-07-20 NOTE — MAU Provider Note (Signed)
History     CSN: [id number]  Arrival date and time: 07/20/16 1041   First Provider Initiated Contact with Patient 07/20/16 1125     Chief Complaint  Patient presents with  . Abdominal Pain   Brooke Donovan is a 25 y.o. Female who presents with abdominal pain. Reports very light cramping for the last few weeks that worsened last night. Sexually active with 1 female partner x 1 year. Denies concern for STD. LMP 06/23/16; no contraception. Last BM was 2 days ago; this is normal frequency for her. Hx of 5 cm complex ovarian cyst a few years ago.    Abdominal Pain  This is a new problem. The current episode started yesterday. The problem occurs constantly. The problem has been unchanged. The pain is located in the suprapubic region, RLQ and LLQ. The pain is at a severity of 7/10. The quality of the pain is sharp. The abdominal pain does not radiate. Associated symptoms include dysuria (suprapubic pain w/urination). Pertinent negatives include no anorexia, constipation, diarrhea, fever, flatus, frequency, hematuria, nausea or vomiting. The pain is aggravated by coughing, movement and urination. The pain is relieved by nothing. She has tried acetaminophen for the symptoms. The treatment provided no relief. There is no history of abdominal surgery or irritable bowel syndrome.   OB History    Gravida Para Term Preterm AB Living   2       2 0   SAB TAB Ectopic Multiple Live Births     2            Past Medical History:  Diagnosis Date  . Abnormal Pap smear   . Chlamydia    2013  . Ovarian cyst     Past Surgical History:  Procedure Laterality Date  . arm surgery x3    . THERAPEUTIC ABORTION    . WISDOM TOOTH EXTRACTION      Family History  Problem Relation Age of Onset  . Hypertension Mother   . Cancer Paternal Grandmother     colon  . Diabetes Neg Hx   . Heart disease Neg Hx   . Hearing loss Neg Hx   . Stroke Neg Hx     Social History  Substance Use Topics  . Smoking  status: Never Smoker  . Smokeless tobacco: Never Used  . Alcohol use Yes     Comment: social    Allergies: No Known Allergies  Prescriptions Prior to Admission  Medication Sig Dispense Refill Last Dose  . Multiple Vitamins-Minerals (MULTIVITAMIN PO) Take 1 tablet by mouth daily.   Not Taking  . norethindrone-ethinyl estradiol (CYCLAFEM,ALYACEN) 0.5/0.75/1-35 MG-MCG tablet Take 1 tablet by mouth daily. 1 Package 11 Taking    Review of Systems  Constitutional: Negative for chills and fever.  Gastrointestinal: Positive for abdominal pain. Negative for anorexia, blood in stool, constipation, diarrhea, flatus, nausea and vomiting.  Genitourinary: Positive for dysuria (suprapubic pain w/urination). Negative for frequency and hematuria.       Denies vaginal bleeding, vaginal discharge, dyspareunia, or postcoital bleeding   Physical Exam   Blood pressure 130/79, pulse 76, temperature 98.4 F (36.9 C), temperature source Oral, resp. rate 16, weight 205 lb 4 oz (93.1 kg), last menstrual period 06/23/2016.  Physical Exam  Nursing note and vitals reviewed. Constitutional: She is oriented to person, place, and time. She appears well-developed and well-nourished. No distress.  HENT:  Head: Normocephalic and atraumatic.  Eyes: Conjunctivae are normal. Right eye exhibits no discharge. Left eye exhibits no  discharge. No scleral icterus.  Neck: Normal range of motion.  Cardiovascular: Normal rate, regular rhythm and normal heart sounds.   No murmur heard. Respiratory: Effort normal and breath sounds normal. No respiratory distress. She has no wheezes.  GI: Soft. Bowel sounds are decreased. There is generalized tenderness (worse in LLQ). There is no rigidity, no rebound and no guarding.  Genitourinary: Vagina normal and uterus normal. Cervix exhibits motion tenderness. Cervix exhibits no discharge and no friability. Right adnexum displays tenderness. Right adnexum displays no mass. Left adnexum  displays tenderness. Left adnexum displays no mass.  Neurological: She is alert and oriented to person, place, and time.  Skin: Skin is warm and dry. She is not diaphoretic.  Psychiatric: She has a normal mood and affect. Her behavior is normal. Judgment and thought content normal.    MAU Course  Procedures Results for orders placed or performed during the hospital encounter of 07/20/16 (from the past 24 hour(s))  Urinalysis, Routine w reflex microscopic (not at [facility])     Status: None   Collection Time: 07/20/16 11:00 AM  Result Value Ref Range   Color, Urine YELLOW YELLOW   APPearance CLEAR CLEAR   Specific Gravity, Urine 1.020 1.005 - 1.030   pH 6.0 5.0 - 8.0   Glucose, UA NEGATIVE NEGATIVE mg/dL   Hgb urine dipstick NEGATIVE NEGATIVE   Bilirubin Urine NEGATIVE NEGATIVE   Ketones, ur NEGATIVE NEGATIVE mg/dL   Protein, ur NEGATIVE NEGATIVE mg/dL   Nitrite NEGATIVE NEGATIVE   Leukocytes, UA NEGATIVE NEGATIVE  Pregnancy, urine POC     Status: None   Collection Time: 07/20/16 11:17 AM  Result Value Ref Range   Preg Test, Ur NEGATIVE NEGATIVE  CBC     Status: Abnormal   Collection Time: 07/20/16 11:35 AM  Result Value Ref Range   WBC 10.6 (H) 4.0 - 10.5 K/uL   RBC 4.21 3.87 - 5.11 MIL/uL   Hemoglobin 12.0 12.0 - 15.0 g/dL   HCT 16.1 09.6 - 04.5 %   MCV 85.7 78.0 - 100.0 fL   MCH 28.5 26.0 - 34.0 pg   MCHC 33.2 30.0 - 36.0 g/dL   RDW 40.9 81.1 - 91.4 %   Platelets 200 150 - 400 K/uL  Wet prep, genital     Status: Abnormal   Collection Time: 07/20/16 12:04 PM  Result Value Ref Range   Yeast Wet Prep HPF POC NONE SEEN NONE SEEN   Trich, Wet Prep NONE SEEN NONE SEEN   Clue Cells Wet Prep HPF POC NONE SEEN NONE SEEN   WBC, Wet Prep HPF POC FEW (A) NONE SEEN   Sperm NONE SEEN    US Transvaginal Non-ob  Result Date: 07/20/2016 CLINICAL DATA:  Acute pelvic pain with tenderness.  Dyspareunia. EXAM: TRANSABDOMINAL AND TRANSVAGINAL ULTRASOUND OF PELVIS TECHNIQUE: Study was  performed transabdominally to optimize pelvic field of view evaluation and transvaginally to optimize internal visceral architecture evaluation. COMPARISON:  October 15, 2012 FINDINGS: Uterus Measurements: 9.1 x 5.6 x 6.0 cm. No fibroids or other mass visualized. Endometrium Thickness: 6 mm.  No focal abnormality visualized. Right ovary Measurements: 6.5 x 3.5 x 4.7 cm. Normal appearance/no adnexal mass. Color flow is seen in the right ovary. Left ovary Measurements: 3.4 x 2.2 x 2.2 cm. Normal appearance/no adnexal mass. Other findings There is a small amount of free pelvic fluid. IMPRESSION: The right ovary appears mildly enlarged but not appreciably edematous. Color flow is seen in the right ovary. The etiology for the  prominence the right ovary is uncertain. Intermittent torsion is a possibility in this circumstance. There is no evidence suggesting infectious etiology. No mass is seen. A small amount of free fluid does suggest recent ovarian cyst rupture. Study otherwise unremarkable. These results will be called to the ordering clinician or representative by the Radiologist Assistant, and communication documented in the PACS or zVision Dashboard. Electronically Signed   By: Bretta BangWilliam  Woodruff III M.D.   On: 07/20/2016 14:38   [place] Pelvis Complete  Result Date: 07/20/2016 CLINICAL DATA:  Acute pelvic pain with tenderness.  Dyspareunia. EXAM: TRANSABDOMINAL AND TRANSVAGINAL ULTRASOUND OF PELVIS TECHNIQUE: Study was performed transabdominally to optimize pelvic field of view evaluation and transvaginally to optimize internal visceral architecture evaluation. COMPARISON:  October 15, 2012 FINDINGS: Uterus Measurements: 9.1 x 5.6 x 6.0 cm. No fibroids or other mass visualized. Endometrium Thickness: 6 mm.  No focal abnormality visualized. Right ovary Measurements: 6.5 x 3.5 x 4.7 cm. Normal appearance/no adnexal mass. Color flow is seen in the right ovary. Left ovary Measurements: 3.4 x 2.2 x 2.2 cm. Normal  appearance/no adnexal mass. Other findings There is a small amount of free pelvic fluid. IMPRESSION: The right ovary appears mildly enlarged but not appreciably edematous. Color flow is seen in the right ovary. The etiology for the prominence the right ovary is uncertain. Intermittent torsion is a possibility in this circumstance. There is no evidence suggesting infectious etiology. No mass is seen. A small amount of free fluid does suggest recent ovarian cyst rupture. Study otherwise unremarkable. These results will be called to the ordering clinician or representative by the Radiologist Assistant, and communication documented in the PACS or zVision Dashboard. Electronically Signed   By: Bretta BangWilliam  Woodruff III M.D.   On: 07/20/2016 14:38     MDM UPT negative CBC, GC/CT, wet prep Toradol 60 mg IM -- pt reports improvement in symptoms NAD, VSS Ultrasound results reviewed with Dr. Macon LargeAnyanwu; patient being discharged home  Evaluation does not show pathology that would require ongoing emergent intervention or inpatient treatment. Pt is hemodynamically stable and mentating appropriately. Discussed findings and plan with patient, who agrees with care plan. All questions answered. Return precautions discussed. Assessment and Plan  A: 1. Right ovarian enlargement   2. CMT (cervical motion tenderness)   3. Acute pelvic pain, female     P: Discharge home Discussed otc vs rx NSAIDS; pt prefers OTC; discussed nsaid use on a schedule for the next few days per package instructions Discussed reasons to return to MAU  GC/CT pending  Judeth Hornrin Luella Gardenhire 07/20/2016, 11:14 AM

## 2016-07-21 LAB — GC/CHLAMYDIA PROBE AMP (~~LOC~~) NOT AT ARMC
Chlamydia: NEGATIVE
Neisseria Gonorrhea: NEGATIVE

## 2016-09-01 ENCOUNTER — Encounter (HOSPITAL_COMMUNITY): Payer: Self-pay | Admitting: *Deleted

## 2016-09-01 ENCOUNTER — Inpatient Hospital Stay (HOSPITAL_COMMUNITY)
Admission: AD | Admit: 2016-09-01 | Discharge: 2016-09-01 | Disposition: A | Discharge status: Home / Self Care | Source: Ambulatory Visit | Attending: Obstetrics & Gynecology | Admitting: Obstetrics & Gynecology

## 2016-09-01 DIAGNOSIS — L739 Follicular disorder, unspecified: Secondary | ICD-10-CM | POA: Diagnosis not present

## 2016-09-01 LAB — POCT PREGNANCY, URINE: Preg Test, Ur: NEGATIVE

## 2016-09-01 MED ORDER — MUPIROCIN 2 % EX OINT: TOPICAL_OINTMENT | CUTANEOUS | 0 refills | Status: AC

## 2016-09-01 NOTE — Discharge Instructions (Signed)
Folliculitis °Folliculitis is redness, soreness, and swelling (inflammation) of the hair follicles. This condition can occur anywhere on the body. People with weakened immune systems, diabetes, or obesity have a greater risk of getting folliculitis. °CAUSES °· Bacterial infection. This is the most common cause. °· Fungal infection. °· Viral infection. °· Contact with certain chemicals, especially oils and tars. °Long-term folliculitis can result from bacteria that live in the nostrils. The bacteria may trigger multiple outbreaks of folliculitis over time. °SYMPTOMS °Folliculitis most commonly occurs on the scalp, thighs, legs, back, buttocks, and areas where hair is shaved frequently. An early sign of folliculitis is a small, white or yellow, pus-filled, itchy lesion (pustule). These lesions appear on a red, inflamed follicle. They are usually less than 0.2 inches (5 mm) wide. When there is an infection of the follicle that goes deeper, it becomes a boil or furuncle. A group of closely packed boils creates a larger lesion (carbuncle). Carbuncles tend to occur in hairy, sweaty areas of the body. °DIAGNOSIS  °Your caregiver can usually tell what is wrong by doing a physical exam. A sample may be taken from one of the lesions and tested in a lab. This can help determine what is causing your folliculitis. °TREATMENT  °Treatment may include: °· Applying warm compresses to the affected areas. °· Taking antibiotic medicines orally or applying them to the skin. °· Draining the lesions if they contain a large amount of pus or fluid. °· Laser hair removal for cases of long-lasting folliculitis. This helps to prevent regrowth of the hair. °HOME CARE INSTRUCTIONS °· Apply warm compresses to the affected areas as directed by your caregiver. °· If antibiotics are prescribed, take them as directed. Finish them even if you start to feel better. °· You may take over-the-counter medicines to relieve itching. °· Do not shave irritated  skin. °· Follow up with your caregiver as directed. °SEEK IMMEDIATE MEDICAL CARE IF:  °· You have increasing redness, swelling, or pain in the affected area. °· You have a fever. °MAKE SURE YOU: °· Understand these instructions. °· Will watch your condition. °· Will get help right away if you are not doing well or get worse. °  °This information is not intended to replace advice given to you by your health care provider. Make sure you discuss any questions you have with your health care provider. °  °Document Released: 01/16/2002 Document Revised: 11/28/2014 Document Reviewed: 02/07/2012 °Elsevier Interactive Patient Education ©2016 [employer] ° °

## 2016-09-01 NOTE — MAU Provider Note (Signed)
History     CSN: [id number]  Arrival date and time: 09/01/16 4098   First Provider Initiated Contact with Patient 09/01/16 1016      No chief complaint on file.  Non-pregnant female here with breakout in genital area x1 week. She reports shaving then 2 days later she noticed the development of white heads. She popped the bumps and they became ulcers then scabbed. Some of them are painful and itchy. No fever or chills. She has remote hx of CMT and GC. No new partner in 6 mos.     Past Medical History:  Diagnosis Date  . Abnormal Pap smear   . Chlamydia    2013  . Ovarian cyst     Past Surgical History:  Procedure Laterality Date  . arm surgery x3    . THERAPEUTIC ABORTION    . WISDOM TOOTH EXTRACTION      Family History  Problem Relation Age of Onset  . Hypertension Mother   . Cancer Paternal Grandmother     colon  . Diabetes Neg Hx   . Heart disease Neg Hx   . Hearing loss Neg Hx   . Stroke Neg Hx     Social History  Substance Use Topics  . Smoking status: Never Smoker  . Smokeless tobacco: Never Used  . Alcohol use Yes     Comment: social    Allergies: No Known Allergies  Prescriptions Prior to Admission  Medication Sig Dispense Refill Last Dose  . acetaminophen (TYLENOL) 325 MG tablet Take 650 mg by mouth every 6 (six) hours as needed for mild pain.   Past Month at Unknown time  . Multiple Vitamins-Minerals (MULTIVITAMIN PO) Take 1 tablet by mouth daily.   09/01/2016 at Unknown time    Review of Systems  Constitutional: Negative.   Gastrointestinal: Negative.   Genitourinary: Negative.    Physical Exam   Blood pressure 153/91, pulse 84, temperature 98.2 F (36.8 C), resp. rate 18, last menstrual period 08/24/2016.  Physical Exam  Constitutional: She is oriented to person, place, and time. She appears well-developed and well-nourished.  HENT:  Head: Normocephalic and atraumatic.  Neck: Normal range of motion.  Cardiovascular: Normal rate.     Respiratory: Effort normal.  Genitourinary:     Genitourinary Comments: Mons shaved, multiple 3-6 mm non-erythemic scabbed lesions at hair follicle on mons pubis  Musculoskeletal: Normal range of motion.  Neurological: She is alert and oriented to person, place, and time.  Skin: Skin is warm and dry.  Psychiatric: She has a normal mood and affect.   Results for orders placed or performed during the hospital encounter of 09/01/16 (from the past 24 hour(s))  Pregnancy, urine POC     Status: None   Collection Time: 09/01/16 10:05 AM  Result Value Ref Range   Preg Test, Ur NEGATIVE NEGATIVE    MAU Course  Procedures  MDM Lesions not consistent with HSV or other infectious process. Will treat for folliculitis. Stable for discharge home.   Assessment and Plan   1. Folliculitis    Discharge home Discontinue shaving Follow up in [facility] as needed Return to MAU for emergencies    Medication List    TAKE these medications   acetaminophen 325 MG tablet Commonly known as:  TYLENOL Take 650 mg by mouth every 6 (six) hours as needed for mild pain.   MULTIVITAMIN PO Take 1 tablet by mouth daily.   mupirocin ointment 2 % Commonly known as:  BACTROBAN Apply to  affected area 3 times daily      Donette LarryMelanie Alvino Lechuga, CNM 09/01/2016, 10:17 AM

## 2016-09-01 NOTE — MAU Note (Signed)
Pt presents to MAU with complaints of irritation right above her hair line, lower abdomen Reports spotting from end of cycle. Denies any abnormal discharge

## 2017-03-07 ENCOUNTER — Ambulatory Visit (INDEPENDENT_AMBULATORY_CARE_PROVIDER_SITE_OTHER): Admitting: Family Medicine

## 2017-03-07 ENCOUNTER — Encounter: Payer: Self-pay | Admitting: Family Medicine

## 2017-03-07 VITALS — BP 125/79 | HR 79 | Ht 70.0 in | Wt 215.9 lb

## 2017-03-07 DIAGNOSIS — N920 Excessive and frequent menstruation with regular cycle: Secondary | ICD-10-CM | POA: Insufficient documentation

## 2017-03-07 DIAGNOSIS — Z113 Encounter for screening for infections with a predominantly sexual mode of transmission: Secondary | ICD-10-CM | POA: Diagnosis not present

## 2017-03-07 DIAGNOSIS — Z124 Encounter for screening for malignant neoplasm of cervix: Secondary | ICD-10-CM | POA: Diagnosis not present

## 2017-03-07 DIAGNOSIS — K5900 Constipation, unspecified: Secondary | ICD-10-CM | POA: Insufficient documentation

## 2017-03-07 DIAGNOSIS — Z01419 Encounter for gynecological examination (general) (routine) without abnormal findings: Secondary | ICD-10-CM

## 2017-03-07 DIAGNOSIS — K5901 Slow transit constipation: Secondary | ICD-10-CM

## 2017-03-07 NOTE — Progress Notes (Signed)
   Subjective:     Brooke Donovan is a 26 y.o. female and is here for a comprehensive physical exam. The patient reports problems - with cycles.Cycles are coming and lasting 2 wks. Bleeds then stops then spotting then comes back full force. For past 2 months. Previously came monthly and were once a month. Note constipation despite fiber and H2O.  Social History   Social History  . Marital status: Single    Spouse name: N/A  . Number of children: N/A  . Years of education: N/A   Occupational History  . Not on file.   Social History Main Topics  . Smoking status: Never Smoker  . Smokeless tobacco: Never Used  . Alcohol use Yes     Comment: social  . Drug use: No  . Sexual activity: Yes   Other Topics Concern  . Not on file   Social History Narrative  . No narrative on file   Health Maintenance  Topic Date Due  . TETANUS/TDAP  07/31/2010  . INFLUENZA VACCINE  06/21/2017  . PAP SMEAR  03/08/2019  . HIV Screening  Completed    The following portions of the patient's history were reviewed and updated as appropriate: allergies, current medications, past family history, past medical history, past social history, past surgical history and problem list.  Review of Systems Pertinent items are noted in HPI.   Objective:    BP 125/79   Pulse 79   Ht  (1.778 m)   Wt 215 lb 14.4 oz (97.9 kg)   LMP 02/04/2017 (Exact Date)   BMI 30.98 kg/m  General appearance: alert, cooperative and appears stated age Head: Normocephalic, without obvious abnormality, atraumatic Neck: no adenopathy, supple, symmetrical, trachea midline and thyroid not enlarged, symmetric, no tenderness/mass/nodules Lungs: clear to auscultation bilaterally Breasts: normal appearance, no masses or tenderness Heart: regular rate and rhythm, S1, S2 normal, no murmur, click, rub or gallop Abdomen: soft, non-tender; bowel sounds normal; no masses,  no organomegaly Pelvic: cervix normal in appearance,  external genitalia normal, no adnexal masses or tenderness, no cervical motion tenderness, uterus normal size, shape, and consistency and vagina normal without discharge Extremities: extremities normal, atraumatic, no cyanosis or edema Pulses: 2+ and symmetric Skin: Skin color, texture, turgor normal. No rashes or lesions Lymph nodes: Cervical, supraclavicular, and axillary nodes normal. Neurologic: Grossly normal    Assessment:    Healthy female exam.      Plan:   Problem List Items Addressed This Visit      Unprioritized   Constipation    See GI if persistent      Menorrhagia with regular cycle    If persistent, would consider pelvic sonogram +/- consideration of IUD--Kyleena       Other Visit Diagnoses    Encounter for gynecological examination without abnormal finding    -  Primary   Relevant Orders   CBC   Comprehensive metabolic panel   TSH   HIV antibody   Lipid panel   RPR   Screening for malignant neoplasm of cervix       Relevant Orders   Cytology - PAP     Return in 1 year (on 03/07/2018).    See After Visit Summary for Counseling Recommendations

## 2017-03-07 NOTE — Assessment & Plan Note (Signed)
If persistent, would consider pelvic sonogram +/- consideration of IUD--Kyleena

## 2017-03-07 NOTE — Patient Instructions (Signed)
Preventive Care 18-39 Years, Female Preventive care refers to lifestyle choices and visits with your health care provider that can promote health and wellness. What does preventive care include?  A yearly physical exam. This is also called an annual well check.  Dental exams once or twice a year.  Routine eye exams. Ask your health care provider how often you should have your eyes checked.  Personal lifestyle choices, including:  Daily care of your teeth and gums.  Regular physical activity.  Eating a healthy diet.  Avoiding tobacco and drug use.  Limiting alcohol use.  Practicing safe sex.  Taking vitamin and mineral supplements as recommended by your health care provider. What happens during an annual well check? The services and screenings done by your health care provider during your annual well check will depend on your age, overall health, lifestyle risk factors, and family history of disease. Counseling  Your health care provider may ask you questions about your:  Alcohol use.  Tobacco use.  Drug use.  Emotional well-being.  Home and relationship well-being.  Sexual activity.  Eating habits.  Work and work environment.  Method of birth control.  Menstrual cycle.  Pregnancy history. Screening  You may have the following tests or measurements:  Height, weight, and BMI.  Diabetes screening. This is done by checking your blood sugar (glucose) after you have not eaten for a while (fasting).  Blood pressure.  Lipid and cholesterol levels. These may be checked every 5 years starting at age 20.  Skin check.  Hepatitis C blood test.  Hepatitis B blood test.  Sexually transmitted disease (STD) testing.  BRCA-related cancer screening. This may be done if you have a family history of breast, ovarian, tubal, or peritoneal cancers.  Pelvic exam and Pap test. This may be done every 3 years starting at age 21. Starting at age 30, this may be done every 5  years if you have a Pap test in combination with an HPV test. Discuss your test results, treatment options, and if necessary, the need for more tests with your health care provider. Vaccines  Your health care provider may recommend certain vaccines, such as:  Influenza vaccine. This is recommended every year.  Tetanus, diphtheria, and acellular pertussis (Tdap, Td) vaccine. You may need a Td booster every 10 years.  Varicella vaccine. You may need this if you have not been vaccinated.  HPV vaccine. If you are 26 or younger, you may need three doses over 6 months.  Measles, mumps, and rubella (MMR) vaccine. You may need at least one dose of MMR. You may also need a second dose.  Pneumococcal 13-valent conjugate (PCV13) vaccine. You may need this if you have certain conditions and were not previously vaccinated.  Pneumococcal polysaccharide (PPSV23) vaccine. You may need one or two doses if you smoke cigarettes or if you have certain conditions.  Meningococcal vaccine. One dose is recommended if you are age 19-21 years and a first-year college student living in a residence hall, or if you have one of several medical conditions. You may also need additional booster doses.  Hepatitis A vaccine. You may need this if you have certain conditions or if you travel or work in places where you may be exposed to hepatitis A.  Hepatitis B vaccine. You may need this if you have certain conditions or if you travel or work in places where you may be exposed to hepatitis B.  Haemophilus influenzae type b (Hib) vaccine. You may need this   if you have certain risk factors. Talk to your health care provider about which screenings and vaccines you need and how often you need them. This information is not intended to replace advice given to you by your health care provider. Make sure you discuss any questions you have with your health care provider. Document Released: 01/03/2002 Document Revised: 07/27/2016  Document Reviewed: 09/08/2015 Elsevier Interactive Patient Education  2017 [employer].

## 2017-03-07 NOTE — Assessment & Plan Note (Signed)
See GI if persistent

## 2017-03-08 LAB — CYTOLOGY - PAP
Adequacy: ABSENT
Bacterial vaginitis: NEGATIVE
Candida vaginitis: NEGATIVE
Chlamydia: NEGATIVE
Diagnosis: NEGATIVE
Neisseria Gonorrhea: NEGATIVE
Trichomonas: NEGATIVE

## 2017-03-08 LAB — LIPID PANEL
Chol/HDL Ratio: 2 ratio (ref 0.0–4.4)
Cholesterol, Total: 175 mg/dL (ref 100–199)
HDL: 88 mg/dL (ref >39)
LDL Calculated: 80 mg/dL (ref 0–99)
Triglycerides: 36 mg/dL (ref 0–149)
VLDL Cholesterol Cal: 7 mg/dL (ref 5–40)

## 2017-03-08 LAB — CBC
Hematocrit: 40.5 % (ref 34.0–46.6)
Hemoglobin: 13.4 g/dL (ref 11.1–15.9)
MCH: 28.6 pg (ref 26.6–33.0)
MCHC: 33.1 g/dL (ref 31.5–35.7)
MCV: 86 fL (ref 79–97)
Platelets: 209 10*3/uL (ref 150–379)
RBC: 4.69 x10E6/uL (ref 3.77–5.28)
RDW: 13.6 % (ref 12.3–15.4)
WBC: 6.5 10*3/uL (ref 3.4–10.8)

## 2017-03-08 LAB — COMPREHENSIVE METABOLIC PANEL
ALT: 16 IU/L (ref 0–32)
AST: 13 IU/L (ref 0–40)
Albumin/Globulin Ratio: 1.6 (ref 1.2–2.2)
Albumin: 4.5 g/dL (ref 3.5–5.5)
Alkaline Phosphatase: 81 IU/L (ref 39–117)
BUN/Creatinine Ratio: 13 (ref 9–23)
BUN: 10 mg/dL (ref 6–20)
Bilirubin Total: 0.4 mg/dL (ref 0.0–1.2)
CO2: 22 mmol/L (ref 18–29)
Calcium: 9.4 mg/dL (ref 8.7–10.2)
Chloride: 99 mmol/L (ref 96–106)
Creatinine, Ser: 0.77 mg/dL (ref 0.57–1.00)
GFR calc Af Amer: 124 mL/min/{1.73_m2} (ref >59)
GFR calc non Af Amer: 108 mL/min/{1.73_m2} (ref >59)
Globulin, Total: 2.8 g/dL (ref 1.5–4.5)
Glucose: 105 mg/dL — ABNORMAL HIGH (ref 65–99)
Potassium: 4.5 mmol/L (ref 3.5–5.2)
Sodium: 140 mmol/L (ref 134–144)
Total Protein: 7.3 g/dL (ref 6.0–8.5)

## 2017-03-08 LAB — RPR: RPR Ser Ql: NONREACTIVE

## 2017-03-08 LAB — TSH: TSH: 1.85 u[IU]/mL (ref 0.450–4.500)

## 2017-03-08 LAB — HIV ANTIBODY (ROUTINE TESTING W REFLEX): HIV Screen 4th Generation wRfx: NONREACTIVE

## 2017-04-10 ENCOUNTER — Encounter: Payer: Self-pay | Admitting: Family Medicine

## 2017-04-11 ENCOUNTER — Other Ambulatory Visit: Payer: Self-pay | Admitting: *Deleted

## 2017-04-11 MED ORDER — METRONIDAZOLE 500 MG PO TABS: 500.0000 mg | ORAL_TABLET | Freq: Two times a day (BID) | ORAL | 0 refills | Status: AC

## 2017-04-11 NOTE — Progress Notes (Signed)
Pt requested Rx for BV via My Chart message - sent as requested per standing order.
